# Patient Record
Sex: Female | Born: 1935 | Race: White | Hispanic: No | Smoking: Never smoker
Health system: Southern US, Community
[De-identification: ages and names within clinical notes are randomized; demographics above are authoritative.]

## PROBLEM LIST (undated history)

## (undated) DIAGNOSIS — E785 Hyperlipidemia, unspecified: Secondary | ICD-10-CM

## (undated) DIAGNOSIS — F32A Depression, unspecified: Secondary | ICD-10-CM

## (undated) DIAGNOSIS — I10 Essential (primary) hypertension: Secondary | ICD-10-CM

## (undated) DIAGNOSIS — I4891 Unspecified atrial fibrillation: Secondary | ICD-10-CM

## (undated) HISTORY — PX: CHOLECYSTECTOMY: SHX55

## (undated) HISTORY — PX: ABDOMINAL HYSTERECTOMY: SHX81

---

## 2005-03-17 ENCOUNTER — Ambulatory Visit: Payer: Self-pay | Admitting: Internal Medicine

## 2005-11-11 ENCOUNTER — Ambulatory Visit: Payer: Self-pay | Admitting: Ophthalmology

## 2005-11-19 ENCOUNTER — Ambulatory Visit: Payer: Self-pay | Admitting: Ophthalmology

## 2006-03-19 ENCOUNTER — Ambulatory Visit: Payer: Self-pay | Admitting: Internal Medicine

## 2007-03-23 ENCOUNTER — Ambulatory Visit: Payer: Self-pay | Admitting: Internal Medicine

## 2008-03-23 ENCOUNTER — Ambulatory Visit: Payer: Self-pay | Admitting: Internal Medicine

## 2008-04-20 ENCOUNTER — Ambulatory Visit: Payer: Self-pay | Admitting: Gastroenterology

## 2009-03-26 ENCOUNTER — Ambulatory Visit: Payer: Self-pay | Admitting: Internal Medicine

## 2010-02-20 ENCOUNTER — Ambulatory Visit: Payer: Self-pay | Admitting: Ophthalmology

## 2010-03-27 ENCOUNTER — Ambulatory Visit: Payer: Self-pay | Admitting: Internal Medicine

## 2011-04-01 ENCOUNTER — Ambulatory Visit: Payer: Self-pay | Admitting: Internal Medicine

## 2012-04-01 ENCOUNTER — Ambulatory Visit: Payer: Self-pay | Admitting: Internal Medicine

## 2012-09-20 ENCOUNTER — Emergency Department: Payer: Self-pay | Admitting: Emergency Medicine

## 2013-04-07 ENCOUNTER — Ambulatory Visit: Payer: Self-pay | Admitting: Internal Medicine

## 2014-03-09 DIAGNOSIS — E785 Hyperlipidemia, unspecified: Secondary | ICD-10-CM | POA: Insufficient documentation

## 2014-03-09 DIAGNOSIS — I10 Essential (primary) hypertension: Secondary | ICD-10-CM | POA: Insufficient documentation

## 2014-04-11 ENCOUNTER — Ambulatory Visit: Payer: Self-pay | Admitting: Internal Medicine

## 2015-03-13 ENCOUNTER — Other Ambulatory Visit: Payer: Self-pay | Admitting: Internal Medicine

## 2015-03-13 DIAGNOSIS — Z1231 Encounter for screening mammogram for malignant neoplasm of breast: Secondary | ICD-10-CM

## 2015-03-20 ENCOUNTER — Ambulatory Visit: Payer: Self-pay

## 2015-04-16 ENCOUNTER — Ambulatory Visit
Admission: RE | Admit: 2015-04-16 | Discharge: 2015-04-16 | Disposition: A | Discharge status: Home / Self Care | Payer: PPO | Source: Ambulatory Visit | Attending: Internal Medicine | Admitting: Internal Medicine

## 2015-04-16 DIAGNOSIS — Z1231 Encounter for screening mammogram for malignant neoplasm of breast: Secondary | ICD-10-CM | POA: Insufficient documentation

## 2015-08-08 ENCOUNTER — Ambulatory Visit
Admission: RE | Admit: 2015-08-08 | Discharge: 2015-08-08 | Disposition: A | Discharge status: Home / Self Care | Payer: PPO | Source: Ambulatory Visit | Attending: Internal Medicine | Admitting: Internal Medicine

## 2015-08-08 ENCOUNTER — Other Ambulatory Visit: Payer: Self-pay | Admitting: Internal Medicine

## 2015-08-08 DIAGNOSIS — R600 Localized edema: Secondary | ICD-10-CM | POA: Diagnosis not present

## 2015-08-08 DIAGNOSIS — R52 Pain, unspecified: Secondary | ICD-10-CM

## 2015-08-08 DIAGNOSIS — R609 Edema, unspecified: Secondary | ICD-10-CM

## 2015-08-08 DIAGNOSIS — I824Z1 Acute embolism and thrombosis of unspecified deep veins of right distal lower extremity: Secondary | ICD-10-CM | POA: Insufficient documentation

## 2015-09-06 DIAGNOSIS — I10 Essential (primary) hypertension: Secondary | ICD-10-CM | POA: Diagnosis not present

## 2015-09-13 DIAGNOSIS — I824Z1 Acute embolism and thrombosis of unspecified deep veins of right distal lower extremity: Secondary | ICD-10-CM | POA: Diagnosis not present

## 2015-09-13 DIAGNOSIS — I10 Essential (primary) hypertension: Secondary | ICD-10-CM | POA: Diagnosis not present

## 2015-10-09 DIAGNOSIS — I824Z1 Acute embolism and thrombosis of unspecified deep veins of right distal lower extremity: Secondary | ICD-10-CM | POA: Diagnosis not present

## 2015-10-16 DIAGNOSIS — I824Z1 Acute embolism and thrombosis of unspecified deep veins of right distal lower extremity: Secondary | ICD-10-CM | POA: Diagnosis not present

## 2015-10-16 DIAGNOSIS — I10 Essential (primary) hypertension: Secondary | ICD-10-CM | POA: Diagnosis not present

## 2016-04-10 DIAGNOSIS — I10 Essential (primary) hypertension: Secondary | ICD-10-CM | POA: Diagnosis not present

## 2016-04-10 DIAGNOSIS — N390 Urinary tract infection, site not specified: Secondary | ICD-10-CM | POA: Diagnosis not present

## 2016-04-17 DIAGNOSIS — I10 Essential (primary) hypertension: Secondary | ICD-10-CM | POA: Diagnosis not present

## 2016-04-17 DIAGNOSIS — Z0001 Encounter for general adult medical examination with abnormal findings: Secondary | ICD-10-CM | POA: Diagnosis not present

## 2016-04-17 DIAGNOSIS — E7800 Pure hypercholesterolemia, unspecified: Secondary | ICD-10-CM | POA: Diagnosis not present

## 2016-04-17 DIAGNOSIS — E2839 Other primary ovarian failure: Secondary | ICD-10-CM | POA: Diagnosis not present

## 2016-04-17 DIAGNOSIS — Z23 Encounter for immunization: Secondary | ICD-10-CM | POA: Diagnosis not present

## 2016-04-17 DIAGNOSIS — Z1231 Encounter for screening mammogram for malignant neoplasm of breast: Secondary | ICD-10-CM | POA: Diagnosis not present

## 2016-04-24 ENCOUNTER — Other Ambulatory Visit: Payer: Self-pay | Admitting: Internal Medicine

## 2016-04-24 DIAGNOSIS — Z1231 Encounter for screening mammogram for malignant neoplasm of breast: Secondary | ICD-10-CM

## 2016-05-06 DIAGNOSIS — E2839 Other primary ovarian failure: Secondary | ICD-10-CM | POA: Diagnosis not present

## 2016-05-09 ENCOUNTER — Ambulatory Visit
Admission: RE | Admit: 2016-05-09 | Discharge: 2016-05-09 | Disposition: A | Discharge status: Home / Self Care | Payer: PPO | Source: Ambulatory Visit | Attending: Internal Medicine | Admitting: Internal Medicine

## 2016-05-09 DIAGNOSIS — Z1231 Encounter for screening mammogram for malignant neoplasm of breast: Secondary | ICD-10-CM

## 2016-06-03 DIAGNOSIS — Z0000 Encounter for general adult medical examination without abnormal findings: Secondary | ICD-10-CM | POA: Diagnosis not present

## 2016-10-10 DIAGNOSIS — I10 Essential (primary) hypertension: Secondary | ICD-10-CM | POA: Diagnosis not present

## 2016-10-17 DIAGNOSIS — R001 Bradycardia, unspecified: Secondary | ICD-10-CM | POA: Diagnosis not present

## 2016-10-17 DIAGNOSIS — E7800 Pure hypercholesterolemia, unspecified: Secondary | ICD-10-CM | POA: Diagnosis not present

## 2016-10-17 DIAGNOSIS — I10 Essential (primary) hypertension: Secondary | ICD-10-CM | POA: Diagnosis not present

## 2016-10-30 DIAGNOSIS — E7800 Pure hypercholesterolemia, unspecified: Secondary | ICD-10-CM | POA: Diagnosis not present

## 2016-10-30 DIAGNOSIS — R002 Palpitations: Secondary | ICD-10-CM | POA: Diagnosis not present

## 2016-10-30 DIAGNOSIS — I10 Essential (primary) hypertension: Secondary | ICD-10-CM | POA: Diagnosis not present

## 2016-11-05 DIAGNOSIS — R002 Palpitations: Secondary | ICD-10-CM | POA: Diagnosis not present

## 2016-11-24 DIAGNOSIS — R002 Palpitations: Secondary | ICD-10-CM | POA: Diagnosis not present

## 2016-11-27 DIAGNOSIS — I493 Ventricular premature depolarization: Secondary | ICD-10-CM | POA: Diagnosis not present

## 2016-11-27 DIAGNOSIS — R002 Palpitations: Secondary | ICD-10-CM | POA: Diagnosis not present

## 2016-11-27 DIAGNOSIS — I10 Essential (primary) hypertension: Secondary | ICD-10-CM | POA: Diagnosis not present

## 2017-01-13 DIAGNOSIS — D2272 Melanocytic nevi of left lower limb, including hip: Secondary | ICD-10-CM | POA: Diagnosis not present

## 2017-01-13 DIAGNOSIS — D2261 Melanocytic nevi of right upper limb, including shoulder: Secondary | ICD-10-CM | POA: Diagnosis not present

## 2017-01-13 DIAGNOSIS — D2262 Melanocytic nevi of left upper limb, including shoulder: Secondary | ICD-10-CM | POA: Diagnosis not present

## 2017-01-13 DIAGNOSIS — D225 Melanocytic nevi of trunk: Secondary | ICD-10-CM | POA: Diagnosis not present

## 2017-01-13 DIAGNOSIS — C44729 Squamous cell carcinoma of skin of left lower limb, including hip: Secondary | ICD-10-CM | POA: Diagnosis not present

## 2017-01-13 DIAGNOSIS — D485 Neoplasm of uncertain behavior of skin: Secondary | ICD-10-CM | POA: Diagnosis not present

## 2017-01-20 DIAGNOSIS — C44729 Squamous cell carcinoma of skin of left lower limb, including hip: Secondary | ICD-10-CM | POA: Diagnosis not present

## 2017-01-29 DIAGNOSIS — I10 Essential (primary) hypertension: Secondary | ICD-10-CM | POA: Diagnosis not present

## 2017-01-29 DIAGNOSIS — I493 Ventricular premature depolarization: Secondary | ICD-10-CM | POA: Diagnosis not present

## 2017-01-29 DIAGNOSIS — R001 Bradycardia, unspecified: Secondary | ICD-10-CM | POA: Insufficient documentation

## 2017-03-13 DIAGNOSIS — I8393 Asymptomatic varicose veins of bilateral lower extremities: Secondary | ICD-10-CM | POA: Diagnosis not present

## 2017-03-31 DIAGNOSIS — F410 Panic disorder [episodic paroxysmal anxiety] without agoraphobia: Secondary | ICD-10-CM | POA: Diagnosis not present

## 2017-04-14 DIAGNOSIS — E7800 Pure hypercholesterolemia, unspecified: Secondary | ICD-10-CM | POA: Diagnosis not present

## 2017-04-14 DIAGNOSIS — I10 Essential (primary) hypertension: Secondary | ICD-10-CM | POA: Diagnosis not present

## 2017-04-14 DIAGNOSIS — R8290 Unspecified abnormal findings in urine: Secondary | ICD-10-CM | POA: Diagnosis not present

## 2017-04-21 DIAGNOSIS — E7800 Pure hypercholesterolemia, unspecified: Secondary | ICD-10-CM | POA: Diagnosis not present

## 2017-04-21 DIAGNOSIS — Z0001 Encounter for general adult medical examination with abnormal findings: Secondary | ICD-10-CM | POA: Diagnosis not present

## 2017-04-21 DIAGNOSIS — I10 Essential (primary) hypertension: Secondary | ICD-10-CM | POA: Diagnosis not present

## 2017-07-20 DIAGNOSIS — R002 Palpitations: Secondary | ICD-10-CM | POA: Diagnosis not present

## 2017-07-20 DIAGNOSIS — R001 Bradycardia, unspecified: Secondary | ICD-10-CM | POA: Diagnosis not present

## 2017-07-20 DIAGNOSIS — I10 Essential (primary) hypertension: Secondary | ICD-10-CM | POA: Diagnosis not present

## 2017-07-20 DIAGNOSIS — I493 Ventricular premature depolarization: Secondary | ICD-10-CM | POA: Diagnosis not present

## 2017-07-20 DIAGNOSIS — E7800 Pure hypercholesterolemia, unspecified: Secondary | ICD-10-CM | POA: Diagnosis not present

## 2017-10-06 DIAGNOSIS — H903 Sensorineural hearing loss, bilateral: Secondary | ICD-10-CM | POA: Diagnosis not present

## 2017-10-08 DIAGNOSIS — H903 Sensorineural hearing loss, bilateral: Secondary | ICD-10-CM | POA: Diagnosis not present

## 2017-10-12 DIAGNOSIS — I10 Essential (primary) hypertension: Secondary | ICD-10-CM | POA: Diagnosis not present

## 2017-10-19 DIAGNOSIS — I10 Essential (primary) hypertension: Secondary | ICD-10-CM | POA: Diagnosis not present

## 2017-10-19 DIAGNOSIS — E538 Deficiency of other specified B group vitamins: Secondary | ICD-10-CM | POA: Diagnosis not present

## 2017-10-19 DIAGNOSIS — Z0000 Encounter for general adult medical examination without abnormal findings: Secondary | ICD-10-CM | POA: Diagnosis not present

## 2017-10-19 DIAGNOSIS — E7800 Pure hypercholesterolemia, unspecified: Secondary | ICD-10-CM | POA: Diagnosis not present

## 2017-11-09 DIAGNOSIS — E538 Deficiency of other specified B group vitamins: Secondary | ICD-10-CM | POA: Diagnosis not present

## 2017-11-16 DIAGNOSIS — E538 Deficiency of other specified B group vitamins: Secondary | ICD-10-CM | POA: Diagnosis not present

## 2017-11-23 DIAGNOSIS — E538 Deficiency of other specified B group vitamins: Secondary | ICD-10-CM | POA: Diagnosis not present

## 2017-11-30 DIAGNOSIS — E538 Deficiency of other specified B group vitamins: Secondary | ICD-10-CM | POA: Diagnosis not present

## 2017-12-31 DIAGNOSIS — E538 Deficiency of other specified B group vitamins: Secondary | ICD-10-CM | POA: Diagnosis not present

## 2018-01-11 DIAGNOSIS — R001 Bradycardia, unspecified: Secondary | ICD-10-CM | POA: Diagnosis not present

## 2018-01-11 DIAGNOSIS — I10 Essential (primary) hypertension: Secondary | ICD-10-CM | POA: Diagnosis not present

## 2018-01-11 DIAGNOSIS — E7800 Pure hypercholesterolemia, unspecified: Secondary | ICD-10-CM | POA: Diagnosis not present

## 2018-01-11 DIAGNOSIS — I493 Ventricular premature depolarization: Secondary | ICD-10-CM | POA: Diagnosis not present

## 2018-01-31 ENCOUNTER — Other Ambulatory Visit: Payer: Self-pay

## 2018-01-31 ENCOUNTER — Emergency Department: Payer: PPO

## 2018-01-31 ENCOUNTER — Emergency Department
Admission: EM | Admit: 2018-01-31 | Discharge: 2018-01-31 | Disposition: A | Discharge status: Home / Self Care | Payer: PPO | Attending: Emergency Medicine | Admitting: Emergency Medicine

## 2018-01-31 DIAGNOSIS — I10 Essential (primary) hypertension: Secondary | ICD-10-CM | POA: Insufficient documentation

## 2018-01-31 DIAGNOSIS — Z79899 Other long term (current) drug therapy: Secondary | ICD-10-CM | POA: Insufficient documentation

## 2018-01-31 DIAGNOSIS — M25561 Pain in right knee: Secondary | ICD-10-CM

## 2018-01-31 DIAGNOSIS — M79604 Pain in right leg: Secondary | ICD-10-CM | POA: Insufficient documentation

## 2018-01-31 DIAGNOSIS — Z7982 Long term (current) use of aspirin: Secondary | ICD-10-CM | POA: Diagnosis not present

## 2018-01-31 DIAGNOSIS — M7121 Synovial cyst of popliteal space [Baker], right knee: Secondary | ICD-10-CM | POA: Insufficient documentation

## 2018-01-31 DIAGNOSIS — M25461 Effusion, right knee: Secondary | ICD-10-CM | POA: Diagnosis not present

## 2018-01-31 HISTORY — DX: Essential (primary) hypertension: I10

## 2018-01-31 NOTE — ED Notes (Signed)
Esign not working at this time. Pt verbalized discharge instructions and has no questions at this time. 

## 2018-01-31 NOTE — ED Triage Notes (Signed)
Pt arrives from [facility] for R knee and calf pain. Anterior knee is swollen. Posterior knee has a knot to it. Pt has hx of blood clots, states feels similar. No redness or extra warmth noted. Alert, oriented. Denies CP or SOB. Not currently taking blood thinners. Sees cardiologist for "extra beats", unsure if a fib.

## 2018-01-31 NOTE — ED Notes (Signed)
First Nurse Note: Pt brought over from [facility] in for possible blood clot in the right leg. Pt is in NAD.

## 2018-01-31 NOTE — ED Notes (Signed)
Patient transported to Ultrasound 

## 2018-01-31 NOTE — ED Provider Notes (Signed)
[facility] Emergency Department Provider Note  ____________________________________________  Time seen: Approximately 2:58 PM  I have reviewed the triage vital signs and the nursing notes.   HISTORY  Chief Complaint Leg Pain    HPI Amy Stanley is a 82 y.o. female who complains of gradual onset of right knee pain for the last few days.  Denies any trauma.  No chest pain shortness of breath fevers chills or sweats.  Reports swelling in the back of the knee as well as in the medial anterior aspect of the knee.  Pain is constant, mild, aching, nonradiating, no alleviating factors.  She reports that earlier this morning her lower leg around the calf was swollen to but that has since resolved.      Past Medical History:  Diagnosis Date  . Hypertension   Palpitations   There are no active problems to display for this patient.    Past Surgical History:  Procedure Laterality Date  . ABDOMINAL HYSTERECTOMY    . CHOLECYSTECTOMY       Prior to Admission medications   Not on File  Lexapro Melatonin Aspirin Lisinopril   Allergies Patient has no known allergies.   No family history on file.  Social History Social History   Tobacco Use  . Smoking status: Never Smoker  Substance Use Topics  . Alcohol use: Never    Frequency: Never  . Drug use: Not on file    Review of Systems  Constitutional:   No fever or chills.  Cardiovascular:   No chest pain or syncope. Respiratory:   No dyspnea or cough. Gastrointestinal:   Negative for abdominal pain, vomiting and diarrhea.  Musculoskeletal:   Right knee pain as above. All other systems reviewed and are negative except as documented above in ROS and HPI.  ____________________________________________   PHYSICAL EXAM:  VITAL SIGNS: ED Triage Vitals  Enc Vitals Group     BP 01/31/18 1308 136/90     Pulse Rate 01/31/18 1308 73     Resp 01/31/18 1308 16     Temp 01/31/18 1308 98.5 F (36.9  C)     Temp Source 01/31/18 1308 Oral     SpO2 01/31/18 1308 96 %     Weight 01/31/18 1309 150 lb (68 kg)     Height 01/31/18 1309 5\' 2"  (1.575 m)     Head Circumference --      Peak Flow --      Pain Score 01/31/18 1308 4     Pain Loc --      Pain Edu? --      Excl. in [place]? --     Vital signs reviewed, nursing assessments reviewed.   Constitutional:   Alert and oriented. Non-toxic appearance. Eyes:   Conjunctivae are normal. EOMI.  ENT      Head:   Normocephalic and atraumatic.            Neck:   No meningismus. Full ROM.  Cardiovascular:    Normal  DP pulses.   Respiratory:   Normal respiratory effort without tachypnea/retractions.   Musculoskeletal:   Normal range of motion in all extremities.  Mild right knee effusion.  Palpable swelling in the popliteal fossa of the right knee consistent with Baker's cyst.  No lower extremity tenderness.  No edema. Neurologic:   Normal speech and language.  Motor grossly intact. No acute focal neurologic deficits are appreciated.  Skin:    Skin is warm, dry and intact. No  rash noted.  No petechiae, purpura, or bullae.  No inflammatory changes.  ____________________________________________    LABS (pertinent positives/negatives) (all labs ordered are listed, but only abnormal results are displayed) Labs Reviewed - No data to display ____________________________________________   EKG    ____________________________________________    RADIOLOGY  US Venous Img Lower Unilateral Right  Result Date: 01/31/2018 CLINICAL DATA:  Right leg pain for 1 day EXAM: RIGHT LOWER EXTREMITY VENOUS DOPPLER ULTRASOUND TECHNIQUE: Gray-scale sonography with graded compression, as well as color Doppler and duplex ultrasound were performed to evaluate the lower extremity deep venous systems from the level of the common femoral vein and including the common femoral, femoral, profunda femoral, popliteal and calf veins including the posterior tibial,  peroneal and gastrocnemius veins when visible. The superficial great saphenous vein was also interrogated. Spectral Doppler was utilized to evaluate flow at rest and with distal augmentation maneuvers in the common femoral, femoral and popliteal veins. COMPARISON:  None. FINDINGS: Contralateral Common Femoral Vein: Respiratory phasicity is normal and symmetric with the symptomatic side. No evidence of thrombus. Normal compressibility. Common Femoral Vein: No evidence of thrombus. Normal compressibility, respiratory phasicity and response to augmentation. Saphenofemoral Junction: No evidence of thrombus. Normal compressibility and flow on color Doppler imaging. Profunda Femoral Vein: No evidence of thrombus. Normal compressibility and flow on color Doppler imaging. Femoral Vein: No evidence of thrombus. Normal compressibility, respiratory phasicity and response to augmentation. Popliteal Vein: No evidence of thrombus. Normal compressibility, respiratory phasicity and response to augmentation. Calf Veins: No evidence of thrombus. Normal compressibility and flow on color Doppler imaging. Superficial Great Saphenous Vein: No evidence of thrombus. Normal compressibility. Venous Reflux:  None. Other Findings: Popliteal cyst is noted measuring 5.5 x 1.8 x 3.6 cm. IMPRESSION: No evidence of deep venous thrombosis. Right popliteal cyst is noted. Electronically Signed   By: Inez Catalina M.D.   On: 01/31/2018 14:25   Dg Knee Complete 4 Views Right  Result Date: 01/31/2018 CLINICAL DATA:  Right knee and calf pain. EXAM: RIGHT KNEE - COMPLETE 4+ VIEW COMPARISON:  None. FINDINGS: No evidence of fracture, or dislocation. Mild 3 compartment osteoarthritic changes. There is a suprapatellar joint effusion. Mild soft tissue swelling about the right knee. IMPRESSION: No evidence of fracture or dislocation of the right knee. Suprapatellar joint effusion. Electronically Signed   By: Fidela Salisbury M.D.   On: 01/31/2018 14:31     ____________________________________________   PROCEDURES Procedures  ____________________________________________    CLINICAL IMPRESSION / ASSESSMENT AND PLAN / ED COURSE  Pertinent labs & imaging results that were available during my care of the patient were reviewed by me and considered in my medical decision making (see chart for details).    Patient presents with pain and swelling of the right knee.  Nontraumatic.  Ultrasound negative for DVT and in fact shows a Baker's cyst explaining her symptoms.  X-ray is negative for fracture dislocation or other acute pathology.  Doubt cellulitis abscess osteomyelitis septic arthritis necrotizing fasciitis.  Patient's nontoxic, suitable for discharge home.  Tylenol as needed, follow-up with primary care.      ____________________________________________   FINAL CLINICAL IMPRESSION(S) / ED DIAGNOSES    Final diagnoses:  Popliteal cyst, right  Acute pain of right knee     ED Discharge Orders    None      Portions of this note were generated with dragon dictation software. Dictation errors may occur despite best attempts at proofreading.    Carrie Mew, MD 01/31/18 1501

## 2018-01-31 NOTE — ED Notes (Signed)
Verbal orders from Dr. Cherylann Banas for imaging but denied needing blood work at this time.

## 2018-01-31 NOTE — Discharge Instructions (Addendum)
No results found for this or any previous visit. US Venous Img Lower Unilateral Right  Result Date: 01/31/2018 CLINICAL DATA:  Right leg pain for 1 day EXAM: RIGHT LOWER EXTREMITY VENOUS DOPPLER ULTRASOUND TECHNIQUE: Gray-scale sonography with graded compression, as well as color Doppler and duplex ultrasound were performed to evaluate the lower extremity deep venous systems from the level of the common femoral vein and including the common femoral, femoral, profunda femoral, popliteal and calf veins including the posterior tibial, peroneal and gastrocnemius veins when visible. The superficial great saphenous vein was also interrogated. Spectral Doppler was utilized to evaluate flow at rest and with distal augmentation maneuvers in the common femoral, femoral and popliteal veins. COMPARISON:  None. FINDINGS: Contralateral Common Femoral Vein: Respiratory phasicity is normal and symmetric with the symptomatic side. No evidence of thrombus. Normal compressibility. Common Femoral Vein: No evidence of thrombus. Normal compressibility, respiratory phasicity and response to augmentation. Saphenofemoral Junction: No evidence of thrombus. Normal compressibility and flow on color Doppler imaging. Profunda Femoral Vein: No evidence of thrombus. Normal compressibility and flow on color Doppler imaging. Femoral Vein: No evidence of thrombus. Normal compressibility, respiratory phasicity and response to augmentation. Popliteal Vein: No evidence of thrombus. Normal compressibility, respiratory phasicity and response to augmentation. Calf Veins: No evidence of thrombus. Normal compressibility and flow on color Doppler imaging. Superficial Great Saphenous Vein: No evidence of thrombus. Normal compressibility. Venous Reflux:  None. Other Findings: Popliteal cyst is noted measuring 5.5 x 1.8 x 3.6 cm. IMPRESSION: No evidence of deep venous thrombosis. Right popliteal cyst is noted. Electronically Signed   By: Inez Catalina M.D.    On: 01/31/2018 14:25   Dg Knee Complete 4 Views Right  Result Date: 01/31/2018 CLINICAL DATA:  Right knee and calf pain. EXAM: RIGHT KNEE - COMPLETE 4+ VIEW COMPARISON:  None. FINDINGS: No evidence of fracture, or dislocation. Mild 3 compartment osteoarthritic changes. There is a suprapatellar joint effusion. Mild soft tissue swelling about the right knee. IMPRESSION: No evidence of fracture or dislocation of the right knee. Suprapatellar joint effusion. Electronically Signed   By: Fidela Salisbury M.D.   On: 01/31/2018 14:31

## 2018-02-01 DIAGNOSIS — E538 Deficiency of other specified B group vitamins: Secondary | ICD-10-CM | POA: Diagnosis not present

## 2018-03-04 DIAGNOSIS — E538 Deficiency of other specified B group vitamins: Secondary | ICD-10-CM | POA: Diagnosis not present

## 2018-04-06 DIAGNOSIS — E538 Deficiency of other specified B group vitamins: Secondary | ICD-10-CM | POA: Diagnosis not present

## 2018-04-15 DIAGNOSIS — I10 Essential (primary) hypertension: Secondary | ICD-10-CM | POA: Diagnosis not present

## 2018-04-15 DIAGNOSIS — E538 Deficiency of other specified B group vitamins: Secondary | ICD-10-CM | POA: Diagnosis not present

## 2018-04-15 DIAGNOSIS — E7800 Pure hypercholesterolemia, unspecified: Secondary | ICD-10-CM | POA: Diagnosis not present

## 2018-04-22 DIAGNOSIS — R42 Dizziness and giddiness: Secondary | ICD-10-CM | POA: Diagnosis not present

## 2018-04-22 DIAGNOSIS — I10 Essential (primary) hypertension: Secondary | ICD-10-CM | POA: Diagnosis not present

## 2018-04-22 DIAGNOSIS — E7800 Pure hypercholesterolemia, unspecified: Secondary | ICD-10-CM | POA: Diagnosis not present

## 2018-04-22 DIAGNOSIS — Z1239 Encounter for other screening for malignant neoplasm of breast: Secondary | ICD-10-CM | POA: Diagnosis not present

## 2018-04-22 DIAGNOSIS — Z0001 Encounter for general adult medical examination with abnormal findings: Secondary | ICD-10-CM | POA: Diagnosis not present

## 2018-04-22 DIAGNOSIS — M7121 Synovial cyst of popliteal space [Baker], right knee: Secondary | ICD-10-CM | POA: Diagnosis not present

## 2018-05-07 DIAGNOSIS — E538 Deficiency of other specified B group vitamins: Secondary | ICD-10-CM | POA: Diagnosis not present

## 2018-06-07 DIAGNOSIS — E538 Deficiency of other specified B group vitamins: Secondary | ICD-10-CM | POA: Diagnosis not present

## 2018-07-08 DIAGNOSIS — E538 Deficiency of other specified B group vitamins: Secondary | ICD-10-CM | POA: Diagnosis not present

## 2018-08-09 DIAGNOSIS — E538 Deficiency of other specified B group vitamins: Secondary | ICD-10-CM | POA: Diagnosis not present

## 2018-08-27 DIAGNOSIS — F329 Major depressive disorder, single episode, unspecified: Secondary | ICD-10-CM | POA: Diagnosis not present

## 2018-08-27 DIAGNOSIS — I10 Essential (primary) hypertension: Secondary | ICD-10-CM | POA: Diagnosis not present

## 2018-08-27 DIAGNOSIS — F32A Depression, unspecified: Secondary | ICD-10-CM | POA: Insufficient documentation

## 2018-08-27 DIAGNOSIS — R413 Other amnesia: Secondary | ICD-10-CM | POA: Diagnosis not present

## 2018-08-27 DIAGNOSIS — G4700 Insomnia, unspecified: Secondary | ICD-10-CM | POA: Insufficient documentation

## 2018-09-08 DIAGNOSIS — R413 Other amnesia: Secondary | ICD-10-CM | POA: Diagnosis not present

## 2018-09-09 DIAGNOSIS — R413 Other amnesia: Secondary | ICD-10-CM | POA: Insufficient documentation

## 2018-12-08 DIAGNOSIS — R413 Other amnesia: Secondary | ICD-10-CM | POA: Diagnosis not present

## 2019-01-27 DIAGNOSIS — E7800 Pure hypercholesterolemia, unspecified: Secondary | ICD-10-CM | POA: Diagnosis not present

## 2019-01-27 DIAGNOSIS — I10 Essential (primary) hypertension: Secondary | ICD-10-CM | POA: Diagnosis not present

## 2019-01-27 DIAGNOSIS — I493 Ventricular premature depolarization: Secondary | ICD-10-CM | POA: Diagnosis not present

## 2019-02-03 DIAGNOSIS — E538 Deficiency of other specified B group vitamins: Secondary | ICD-10-CM | POA: Diagnosis not present

## 2019-02-03 DIAGNOSIS — Z0000 Encounter for general adult medical examination without abnormal findings: Secondary | ICD-10-CM | POA: Diagnosis not present

## 2019-02-03 DIAGNOSIS — F329 Major depressive disorder, single episode, unspecified: Secondary | ICD-10-CM | POA: Diagnosis not present

## 2019-02-03 DIAGNOSIS — E7800 Pure hypercholesterolemia, unspecified: Secondary | ICD-10-CM | POA: Diagnosis not present

## 2019-02-03 DIAGNOSIS — I10 Essential (primary) hypertension: Secondary | ICD-10-CM | POA: Diagnosis not present

## 2019-02-03 DIAGNOSIS — G4700 Insomnia, unspecified: Secondary | ICD-10-CM | POA: Diagnosis not present

## 2019-02-03 DIAGNOSIS — R413 Other amnesia: Secondary | ICD-10-CM | POA: Diagnosis not present

## 2019-04-18 DIAGNOSIS — R413 Other amnesia: Secondary | ICD-10-CM | POA: Diagnosis not present

## 2019-06-06 DIAGNOSIS — E7800 Pure hypercholesterolemia, unspecified: Secondary | ICD-10-CM | POA: Diagnosis not present

## 2019-06-06 DIAGNOSIS — R197 Diarrhea, unspecified: Secondary | ICD-10-CM | POA: Diagnosis not present

## 2019-06-06 DIAGNOSIS — E538 Deficiency of other specified B group vitamins: Secondary | ICD-10-CM | POA: Diagnosis not present

## 2019-06-06 DIAGNOSIS — R413 Other amnesia: Secondary | ICD-10-CM | POA: Diagnosis not present

## 2019-06-06 DIAGNOSIS — I10 Essential (primary) hypertension: Secondary | ICD-10-CM | POA: Diagnosis not present

## 2019-06-06 DIAGNOSIS — F329 Major depressive disorder, single episode, unspecified: Secondary | ICD-10-CM | POA: Diagnosis not present

## 2019-07-22 DIAGNOSIS — Z08 Encounter for follow-up examination after completed treatment for malignant neoplasm: Secondary | ICD-10-CM | POA: Diagnosis not present

## 2019-07-22 DIAGNOSIS — Z85828 Personal history of other malignant neoplasm of skin: Secondary | ICD-10-CM | POA: Diagnosis not present

## 2019-07-22 DIAGNOSIS — D0421 Carcinoma in situ of skin of right ear and external auricular canal: Secondary | ICD-10-CM | POA: Diagnosis not present

## 2019-07-22 DIAGNOSIS — D485 Neoplasm of uncertain behavior of skin: Secondary | ICD-10-CM | POA: Diagnosis not present

## 2019-08-30 DIAGNOSIS — R413 Other amnesia: Secondary | ICD-10-CM | POA: Diagnosis not present

## 2019-09-27 DIAGNOSIS — D0421 Carcinoma in situ of skin of right ear and external auricular canal: Secondary | ICD-10-CM | POA: Diagnosis not present

## 2019-10-07 DIAGNOSIS — I10 Essential (primary) hypertension: Secondary | ICD-10-CM | POA: Diagnosis not present

## 2019-10-07 DIAGNOSIS — E7800 Pure hypercholesterolemia, unspecified: Secondary | ICD-10-CM | POA: Diagnosis not present

## 2019-10-07 DIAGNOSIS — F329 Major depressive disorder, single episode, unspecified: Secondary | ICD-10-CM | POA: Diagnosis not present

## 2019-10-07 DIAGNOSIS — Z0000 Encounter for general adult medical examination without abnormal findings: Secondary | ICD-10-CM | POA: Diagnosis not present

## 2019-10-07 DIAGNOSIS — R413 Other amnesia: Secondary | ICD-10-CM | POA: Diagnosis not present

## 2019-10-07 DIAGNOSIS — Z0001 Encounter for general adult medical examination with abnormal findings: Secondary | ICD-10-CM | POA: Diagnosis not present

## 2019-10-07 DIAGNOSIS — G4700 Insomnia, unspecified: Secondary | ICD-10-CM | POA: Diagnosis not present

## 2020-01-11 DIAGNOSIS — I493 Ventricular premature depolarization: Secondary | ICD-10-CM | POA: Diagnosis not present

## 2020-01-11 DIAGNOSIS — R9431 Abnormal electrocardiogram [ECG] [EKG]: Secondary | ICD-10-CM | POA: Diagnosis not present

## 2020-01-11 DIAGNOSIS — I10 Essential (primary) hypertension: Secondary | ICD-10-CM | POA: Diagnosis not present

## 2020-01-11 DIAGNOSIS — E7800 Pure hypercholesterolemia, unspecified: Secondary | ICD-10-CM | POA: Diagnosis not present

## 2020-03-12 DIAGNOSIS — R413 Other amnesia: Secondary | ICD-10-CM | POA: Diagnosis not present

## 2020-04-10 DIAGNOSIS — E538 Deficiency of other specified B group vitamins: Secondary | ICD-10-CM | POA: Diagnosis not present

## 2020-04-10 DIAGNOSIS — R197 Diarrhea, unspecified: Secondary | ICD-10-CM | POA: Insufficient documentation

## 2020-04-10 DIAGNOSIS — Z23 Encounter for immunization: Secondary | ICD-10-CM | POA: Diagnosis not present

## 2020-04-10 DIAGNOSIS — G4700 Insomnia, unspecified: Secondary | ICD-10-CM | POA: Diagnosis not present

## 2020-04-10 DIAGNOSIS — R413 Other amnesia: Secondary | ICD-10-CM | POA: Diagnosis not present

## 2020-04-10 DIAGNOSIS — I10 Essential (primary) hypertension: Secondary | ICD-10-CM | POA: Diagnosis not present

## 2020-04-10 DIAGNOSIS — E7800 Pure hypercholesterolemia, unspecified: Secondary | ICD-10-CM | POA: Diagnosis not present

## 2020-04-10 DIAGNOSIS — F329 Major depressive disorder, single episode, unspecified: Secondary | ICD-10-CM | POA: Diagnosis not present

## 2020-04-10 DIAGNOSIS — R002 Palpitations: Secondary | ICD-10-CM | POA: Diagnosis not present

## 2020-05-16 DIAGNOSIS — R058 Other specified cough: Secondary | ICD-10-CM | POA: Diagnosis not present

## 2020-05-16 DIAGNOSIS — J40 Bronchitis, not specified as acute or chronic: Secondary | ICD-10-CM | POA: Diagnosis not present

## 2020-05-16 DIAGNOSIS — R531 Weakness: Secondary | ICD-10-CM | POA: Diagnosis not present

## 2020-05-28 DIAGNOSIS — R197 Diarrhea, unspecified: Secondary | ICD-10-CM | POA: Diagnosis not present

## 2020-05-28 DIAGNOSIS — J189 Pneumonia, unspecified organism: Secondary | ICD-10-CM | POA: Diagnosis not present

## 2020-05-30 DIAGNOSIS — R197 Diarrhea, unspecified: Secondary | ICD-10-CM | POA: Diagnosis not present

## 2020-06-06 DIAGNOSIS — R197 Diarrhea, unspecified: Secondary | ICD-10-CM | POA: Diagnosis not present

## 2020-06-27 ENCOUNTER — Other Ambulatory Visit
Admission: RE | Admit: 2020-06-27 | Discharge: 2020-06-27 | Disposition: A | Discharge status: Home / Self Care | Payer: PPO | Source: Ambulatory Visit | Attending: Gastroenterology | Admitting: Gastroenterology

## 2020-06-27 DIAGNOSIS — R197 Diarrhea, unspecified: Secondary | ICD-10-CM | POA: Insufficient documentation

## 2020-07-02 LAB — PANCREATIC ELASTASE, FECAL: Pancreatic Elastase-1, Stool: 410 ug Elast./g (ref >200)

## 2020-07-24 DIAGNOSIS — R410 Disorientation, unspecified: Secondary | ICD-10-CM | POA: Diagnosis not present

## 2020-07-24 DIAGNOSIS — R413 Other amnesia: Secondary | ICD-10-CM | POA: Diagnosis not present

## 2020-09-14 DIAGNOSIS — R399 Unspecified symptoms and signs involving the genitourinary system: Secondary | ICD-10-CM | POA: Diagnosis not present

## 2020-09-18 DIAGNOSIS — D2272 Melanocytic nevi of left lower limb, including hip: Secondary | ICD-10-CM | POA: Diagnosis not present

## 2020-09-18 DIAGNOSIS — L608 Other nail disorders: Secondary | ICD-10-CM | POA: Diagnosis not present

## 2020-09-18 DIAGNOSIS — D225 Melanocytic nevi of trunk: Secondary | ICD-10-CM | POA: Diagnosis not present

## 2020-09-18 DIAGNOSIS — D2261 Melanocytic nevi of right upper limb, including shoulder: Secondary | ICD-10-CM | POA: Diagnosis not present

## 2020-09-18 DIAGNOSIS — D2271 Melanocytic nevi of right lower limb, including hip: Secondary | ICD-10-CM | POA: Diagnosis not present

## 2020-09-18 DIAGNOSIS — D2262 Melanocytic nevi of left upper limb, including shoulder: Secondary | ICD-10-CM | POA: Diagnosis not present

## 2020-09-24 DIAGNOSIS — R197 Diarrhea, unspecified: Secondary | ICD-10-CM | POA: Diagnosis not present

## 2020-09-24 DIAGNOSIS — R630 Anorexia: Secondary | ICD-10-CM | POA: Diagnosis not present

## 2020-09-24 DIAGNOSIS — H9190 Unspecified hearing loss, unspecified ear: Secondary | ICD-10-CM | POA: Diagnosis not present

## 2020-09-24 DIAGNOSIS — R413 Other amnesia: Secondary | ICD-10-CM | POA: Diagnosis not present

## 2020-09-24 DIAGNOSIS — F05 Delirium due to known physiological condition: Secondary | ICD-10-CM | POA: Diagnosis not present

## 2020-09-25 DIAGNOSIS — R630 Anorexia: Secondary | ICD-10-CM | POA: Insufficient documentation

## 2020-09-25 DIAGNOSIS — H9190 Unspecified hearing loss, unspecified ear: Secondary | ICD-10-CM | POA: Insufficient documentation

## 2020-09-25 DIAGNOSIS — F05 Delirium due to known physiological condition: Secondary | ICD-10-CM | POA: Insufficient documentation

## 2020-10-03 DIAGNOSIS — E7800 Pure hypercholesterolemia, unspecified: Secondary | ICD-10-CM | POA: Diagnosis not present

## 2020-10-03 DIAGNOSIS — E538 Deficiency of other specified B group vitamins: Secondary | ICD-10-CM | POA: Diagnosis not present

## 2020-10-03 DIAGNOSIS — I10 Essential (primary) hypertension: Secondary | ICD-10-CM | POA: Diagnosis not present

## 2020-10-06 ENCOUNTER — Inpatient Hospital Stay
Admission: EM | Admit: 2020-10-06 | Discharge: 2020-10-08 | DRG: 309 | Disposition: A | Discharge status: Home / Self Care | Payer: PPO | Attending: Obstetrics and Gynecology | Admitting: Obstetrics and Gynecology

## 2020-10-06 ENCOUNTER — Other Ambulatory Visit: Payer: Self-pay

## 2020-10-06 DIAGNOSIS — Z20822 Contact with and (suspected) exposure to covid-19: Secondary | ICD-10-CM | POA: Diagnosis present

## 2020-10-06 DIAGNOSIS — G309 Alzheimer's disease, unspecified: Secondary | ICD-10-CM | POA: Diagnosis not present

## 2020-10-06 DIAGNOSIS — I10 Essential (primary) hypertension: Secondary | ICD-10-CM | POA: Diagnosis present

## 2020-10-06 DIAGNOSIS — I4891 Unspecified atrial fibrillation: Secondary | ICD-10-CM | POA: Diagnosis not present

## 2020-10-06 DIAGNOSIS — R000 Tachycardia, unspecified: Secondary | ICD-10-CM | POA: Diagnosis not present

## 2020-10-06 DIAGNOSIS — F0390 Unspecified dementia without behavioral disturbance: Secondary | ICD-10-CM | POA: Diagnosis not present

## 2020-10-06 DIAGNOSIS — I493 Ventricular premature depolarization: Secondary | ICD-10-CM | POA: Diagnosis not present

## 2020-10-06 DIAGNOSIS — N390 Urinary tract infection, site not specified: Secondary | ICD-10-CM | POA: Diagnosis present

## 2020-10-06 DIAGNOSIS — R350 Frequency of micturition: Secondary | ICD-10-CM | POA: Diagnosis present

## 2020-10-06 DIAGNOSIS — F0280 Dementia in other diseases classified elsewhere without behavioral disturbance: Secondary | ICD-10-CM | POA: Diagnosis not present

## 2020-10-06 DIAGNOSIS — F32A Depression, unspecified: Secondary | ICD-10-CM | POA: Diagnosis present

## 2020-10-06 DIAGNOSIS — K529 Noninfective gastroenteritis and colitis, unspecified: Secondary | ICD-10-CM | POA: Diagnosis present

## 2020-10-06 DIAGNOSIS — R451 Restlessness and agitation: Secondary | ICD-10-CM | POA: Diagnosis present

## 2020-10-06 DIAGNOSIS — G4700 Insomnia, unspecified: Secondary | ICD-10-CM | POA: Diagnosis present

## 2020-10-06 DIAGNOSIS — Z90710 Acquired absence of both cervix and uterus: Secondary | ICD-10-CM | POA: Diagnosis not present

## 2020-10-06 DIAGNOSIS — N3000 Acute cystitis without hematuria: Secondary | ICD-10-CM | POA: Diagnosis not present

## 2020-10-06 DIAGNOSIS — Z66 Do not resuscitate: Secondary | ICD-10-CM | POA: Diagnosis not present

## 2020-10-06 DIAGNOSIS — R4020 Unspecified coma: Secondary | ICD-10-CM | POA: Diagnosis not present

## 2020-10-06 DIAGNOSIS — R55 Syncope and collapse: Secondary | ICD-10-CM | POA: Diagnosis present

## 2020-10-06 DIAGNOSIS — I480 Paroxysmal atrial fibrillation: Secondary | ICD-10-CM | POA: Diagnosis not present

## 2020-10-06 DIAGNOSIS — F329 Major depressive disorder, single episode, unspecified: Secondary | ICD-10-CM | POA: Diagnosis present

## 2020-10-06 DIAGNOSIS — Z9049 Acquired absence of other specified parts of digestive tract: Secondary | ICD-10-CM

## 2020-10-06 DIAGNOSIS — R42 Dizziness and giddiness: Secondary | ICD-10-CM | POA: Diagnosis not present

## 2020-10-06 DIAGNOSIS — E860 Dehydration: Secondary | ICD-10-CM | POA: Diagnosis not present

## 2020-10-06 LAB — COMPREHENSIVE METABOLIC PANEL
ALT: 14 U/L (ref 0–44)
AST: 29 U/L (ref 15–41)
Albumin: 2.9 g/dL — ABNORMAL LOW (ref 3.5–5.0)
Alkaline Phosphatase: 35 U/L — ABNORMAL LOW (ref 38–126)
Anion gap: 8 (ref 5–15)
BUN: 14 mg/dL (ref 8–23)
CO2: 17 mmol/L — ABNORMAL LOW (ref 22–32)
Calcium: 7.9 mg/dL — ABNORMAL LOW (ref 8.9–10.3)
Chloride: 116 mmol/L — ABNORMAL HIGH (ref 98–111)
Creatinine, Ser: 0.86 mg/dL (ref 0.44–1.00)
GFR, Estimated: >60 mL/min (ref >60)
Glucose, Bld: 112 mg/dL — ABNORMAL HIGH (ref 70–99)
Potassium: 3.6 mmol/L (ref 3.5–5.1)
Sodium: 141 mmol/L (ref 135–145)
Total Bilirubin: 0.9 mg/dL (ref 0.3–1.2)
Total Protein: 5.2 g/dL — ABNORMAL LOW (ref 6.5–8.1)

## 2020-10-06 LAB — TROPONIN I (HIGH SENSITIVITY): Troponin I (High Sensitivity): 29 ng/L — ABNORMAL HIGH (ref <18)

## 2020-10-06 LAB — URINALYSIS, COMPLETE (UACMP) WITH MICROSCOPIC
Bilirubin Urine: NEGATIVE
Glucose, UA: NEGATIVE mg/dL
Ketones, ur: NEGATIVE mg/dL
Nitrite: NEGATIVE
Protein, ur: 100 mg/dL — AB
RBC / HPF: >50 RBC/hpf — ABNORMAL HIGH (ref 0–5)
Specific Gravity, Urine: 1.008 (ref 1.005–1.030)
Squamous Epithelial / LPF: NONE SEEN (ref 0–5)
WBC, UA: >50 WBC/hpf — ABNORMAL HIGH (ref 0–5)
pH: 8 (ref 5.0–8.0)

## 2020-10-06 LAB — TSH: TSH: 1.737 u[IU]/mL (ref 0.350–4.500)

## 2020-10-06 LAB — CBC
HCT: 42.4 % (ref 36.0–46.0)
Hemoglobin: 13.7 g/dL (ref 12.0–15.0)
MCH: 31 pg (ref 26.0–34.0)
MCHC: 32.3 g/dL (ref 30.0–36.0)
MCV: 95.9 fL (ref 80.0–100.0)
Platelets: 215 10*3/uL (ref 150–400)
RBC: 4.42 MIL/uL (ref 3.87–5.11)
RDW: 14.6 % (ref 11.5–15.5)
WBC: 6.6 10*3/uL (ref 4.0–10.5)
nRBC: 0 % (ref 0.0–0.2)

## 2020-10-06 MED ORDER — ACETAMINOPHEN 325 MG PO TABS: 650.0000 mg | ORAL_TABLET | Freq: Four times a day (QID) | ORAL | Status: DC | PRN

## 2020-10-06 MED ORDER — SODIUM CHLORIDE 0.9 % IV SOLN
1.0000 g | INTRAVENOUS | Status: DC
  Filled 2020-10-06: qty 10

## 2020-10-06 MED ORDER — DILTIAZEM HCL-DEXTROSE 125-5 MG/125ML-% IV SOLN (PREMIX): 5.0000 mg/h | INTRAVENOUS | Status: DC

## 2020-10-06 MED ORDER — TRAZODONE HCL 50 MG PO TABS
50.0000 mg | ORAL_TABLET | Freq: Every day | ORAL | Status: DC
Start: 2020-10-06 — End: 2020-10-08
  Administered 2020-10-06 – 2020-10-07 (×2): 50 mg via ORAL
  Filled 2020-10-06 (×2): qty 1

## 2020-10-06 MED ORDER — ACETAMINOPHEN 650 MG RE SUPP: 650.0000 mg | Freq: Four times a day (QID) | RECTAL | Status: DC | PRN

## 2020-10-06 MED ORDER — SODIUM CHLORIDE 0.9% FLUSH
3.0000 mL | Freq: Two times a day (BID) | INTRAVENOUS | Status: DC
  Administered 2020-10-06 – 2020-10-07 (×3): 3 mL via INTRAVENOUS

## 2020-10-06 MED ORDER — SODIUM CHLORIDE 0.9 % IV SOLN: INTRAVENOUS | Status: DC

## 2020-10-06 MED ORDER — ESCITALOPRAM OXALATE 10 MG PO TABS
10.0000 mg | ORAL_TABLET | Freq: Every day | ORAL | Status: DC
Start: 2020-10-07 — End: 2020-10-08
  Administered 2020-10-07 – 2020-10-08 (×2): 10 mg via ORAL
  Filled 2020-10-06 (×2): qty 1

## 2020-10-06 MED ORDER — SODIUM CHLORIDE 0.9 % IV SOLN
1000.0000 mL | Freq: Once | INTRAVENOUS | Status: AC
  Administered 2020-10-06: 1000 mL via INTRAVENOUS

## 2020-10-06 MED ORDER — ONDANSETRON HCL 4 MG PO TABS: 4.0000 mg | ORAL_TABLET | Freq: Four times a day (QID) | ORAL | Status: DC | PRN

## 2020-10-06 MED ORDER — SODIUM CHLORIDE 0.9 % IV SOLN: 250.0000 mL | INTRAVENOUS | Status: DC | PRN

## 2020-10-06 MED ORDER — MEMANTINE HCL 5 MG PO TABS
5.0000 mg | ORAL_TABLET | Freq: Two times a day (BID) | ORAL | Status: DC
  Administered 2020-10-06 – 2020-10-08 (×4): 5 mg via ORAL
  Filled 2020-10-06 (×5): qty 1

## 2020-10-06 MED ORDER — SODIUM CHLORIDE 0.9 % IV SOLN
1.0000 g | Freq: Once | INTRAVENOUS | Status: AC
  Administered 2020-10-06: 1 g via INTRAVENOUS
  Filled 2020-10-06: qty 10

## 2020-10-06 MED ORDER — DILTIAZEM HCL ER COATED BEADS 180 MG PO CP24
180.0000 mg | ORAL_CAPSULE | Freq: Every day | ORAL | Status: DC
  Administered 2020-10-06 – 2020-10-08 (×3): 180 mg via ORAL
  Filled 2020-10-06 (×4): qty 1

## 2020-10-06 MED ORDER — ENOXAPARIN SODIUM 40 MG/0.4ML ~~LOC~~ SOLN
40.0000 mg | SUBCUTANEOUS | Status: DC
  Administered 2020-10-06 – 2020-10-07 (×2): 40 mg via SUBCUTANEOUS
  Filled 2020-10-06 (×2): qty 0.4

## 2020-10-06 MED ORDER — LISINOPRIL 10 MG PO TABS
5.0000 mg | ORAL_TABLET | Freq: Every day | ORAL | Status: DC
Start: 2020-10-07 — End: 2020-10-08
  Administered 2020-10-07 – 2020-10-08 (×2): 5 mg via ORAL
  Filled 2020-10-06 (×2): qty 1

## 2020-10-06 MED ORDER — SODIUM CHLORIDE 0.9% FLUSH: 3.0000 mL | INTRAVENOUS | Status: DC | PRN

## 2020-10-06 MED ORDER — ONDANSETRON HCL 4 MG/2ML IJ SOLN: 4.0000 mg | Freq: Four times a day (QID) | INTRAMUSCULAR | Status: DC | PRN

## 2020-10-06 NOTE — ED Notes (Signed)
Cardiology at bedside.

## 2020-10-06 NOTE — ED Notes (Signed)
Pt had small amount of brown/yellow liquid diarrhea in brief. Cleaned with wipes, new brief placed on pt.

## 2020-10-06 NOTE — ED Notes (Signed)
Pt sat up to eat lunch tray. Son remains at bedside.

## 2020-10-06 NOTE — ED Triage Notes (Addendum)
Pt arrives [facility] from home for syncopal episode this AM. Son told EMS that pt was walking back from bathroom and appeared shaky. Son assisted pt to sitting position and pt passed out for a few seconds. VSS and CBG WNL. Pt with hx dementia. EMS states intermittent diarrhea x 2 years. Pt told EMS she had diarrhea on the way to ER. This RN noticed very small amount of liquid stool to brief. Cleaned and changed brief. Denies pain at this time. Denies dizziness or weakness. EMS gave pt 544ml NS enroute.

## 2020-10-06 NOTE — Consult Note (Signed)
[facility] Cardiology  CARDIOLOGY CONSULT NOTE  Patient ID: ASMI FUGERE MRN: [id number] DOB/AGE: March 31, 1936 85 y.o.  Admit date: 10/06/2020 Referring Physician [place] Primary Physician [facility] Cardiologist Nehemiah Massed Reason for Consultation syncope, paroxysmal atrial fibrillation  HPI: 85 year old female referred for syncope and paroxysmal atrial fibrillation.  The patient has advanced dementia and currently lives with her son who provides her clinical history.  This morning, while she was walking back from the bathroom, she appeared shaky, and required assistance to sit in a chair.  Approximately one hour later, she was not responding and the son assisted her to lay on the ground, then the patient had a large loose bowel movement.  EMS was called, the patient received 500 cc of normal saline, and brought to [facility] ED.  Initial ECG revealed atrial fibrillation at a rate of 118 bpm, and the patient was treated with diltiazem IV bolus, and converted to sinus rhythm.  Patient denies chest pain or shortness of breath.  The patient remains in sinus rhythm.  Admission labs notable for initial high-sensitivity troponin of 29.  Blood pressure is 170/95.  The patient currently takes lisinopril for essential hypertension.  Review of systems complete and found to be negative unless listed above     Past Medical History:  Diagnosis Date  . Hypertension     Past Surgical History:  Procedure Laterality Date  . ABDOMINAL HYSTERECTOMY    . CHOLECYSTECTOMY      (Not in a hospital admission)  Social History   Socioeconomic History  . Marital status: Widowed    Spouse name: Not on file  . Number of children: Not on file  . Years of education: Not on file  . Highest education level: Not on file  Occupational History  . Not on file  Tobacco Use  . Smoking status: Never Smoker  . Smokeless tobacco: Not on file  Substance and Sexual Activity  . Alcohol use: Never  . Drug use: Not on file  .  Sexual activity: Not on file  Other Topics Concern  . Not on file  Social History Narrative  . Not on file   Social Determinants of Health   Financial Resource Strain: Not on file  Food Insecurity: Not on file  Transportation Needs: Not on file  Physical Activity: Not on file  Stress: Not on file  Social Connections: Not on file  Intimate Partner Violence: Not on file    History reviewed. No pertinent family history.    Review of systems complete and found to be negative unless listed above      PHYSICAL EXAM  General: Well developed, well nourished, in no acute distress HEENT:  Normocephalic and atramatic Neck:  No JVD.  Lungs: Clear bilaterally to auscultation and percussion. Heart: HRRR . Normal S1 and S2 without gallops or murmurs.  Abdomen: Bowel sounds are positive, abdomen soft and non-tender  Msk:  Back normal, normal gait. Normal strength and tone for age. Extremities: No clubbing, cyanosis or edema.   Neuro: Alert and oriented X 3. Psych:  Good affect, responds appropriately  Labs:   Lab Results  Component Value Date   WBC 6.6 10/06/2020   HGB 13.7 10/06/2020   HCT 42.4 10/06/2020   MCV 95.9 10/06/2020   PLT 215 10/06/2020    Recent Labs  Lab 10/06/20 1020  NA 141  K 3.6  CL 116*  CO2 17*  BUN 14  CREATININE 0.86  CALCIUM 7.9*  PROT 5.2*  BILITOT 0.9  ALKPHOS 35*  ALT 14  AST 29  GLUCOSE 112*   No results found for: CKTOTAL, CKMB, CKMBINDEX, TROPONINI No results found for: CHOL No results found for: HDL No results found for: LDLCALC No results found for: TRIG No results found for: CHOLHDL No results found for: LDLDIRECT    Radiology: No results found.  EKG: Sinus rhythm at 85 bpm with frequent premature atrial contraction  ASSESSMENT AND PLAN:   1.  Near syncope, without complete loss of consciousness, in the setting of atrial fibrillation with rapid ventricular rate 2.  Paroxysmal atrial fibrillation, converted to sinus rhythm  with diltiazem IV bolus 3.  Essential hypertension, blood pressure mildly elevated  Recommendations  1.  Agree with overall current therapy 2.  Defer chronic anticoagulation at this time 3.  Start Cardizem CD 180 mg daily 4.  Review 2D echocardiogram 5.  Further recommendations pending echocardiogram results  Signed: Isaias Cowman MD,PhD, [facility] 10/06/2020, 2:00 PM

## 2020-10-06 NOTE — ED Notes (Signed)
Called lab d/t labs not being "in process" at this time. Stated they would begin running CMP and troponin at this time.

## 2020-10-06 NOTE — H&P (Addendum)
History and Physical    Amy Stanley QMG:[id number] DOB: Nov 07, 1935 DOA: 10/06/2020  PCP: Madelyn Brunner, MD   Patient coming from: Home  I have personally briefly reviewed patient's old medical records in [place]  Chief Complaint: " She passed out"  History is obtained from patient's son at the bedside.  Patient is unable to provide any history due to her dementia.  HPI: Amy Stanley is a 85 y.o. female with medical history significant for advanced dementia, hypertension and depression who was brought into the ER by EMS for evaluation of a witnessed syncopal episode.  Patient son who provides most of the history states that this morning while she was walking back from the bathroom she appeared shaky like she was going to fall.  He assisted her into a chair and provided her with something to eat because he thought she was dehydrated.  Patient was okay until about an hour later when he noticed that she was not responding like she normally would and then she appeared like she was going to fall.  He was able to assist her to the ground and then patient had a large loose bowel movement.  She was able to respond to him that he lowered her to the ground.   He called EMS and patient received 500 cc of normal saline in route to the hospital. I am unable to do a review of systems on this patient due to her dementia but the son states that she has had issues with " diarrhea" and was started on Imodium which eventually caused constipation for which she was treated with some stool softeners.  Patient son also states that they were told in the past that she had an abnormal heart rhythm and that she had followed up with a cardiologist for couple of years.  He is unsure what the rhythm was. Labs show sodium 141, potassium 3.6, chloride 116, bicarb 17, glucose 112, BUN 14, creatinine 0.86, calcium 7.9, alkaline phosphatase 35, albumin 2.9, AST 29, ALT 14, total protein 5.2, total bilirubin 0.9,  white count 6.6, hemoglobin 13.7, hematocrit 42.4, MCV 95.9, RDW 14.6, platelet count 250 Patient SARS coronavirus 2 point-of-care test is pending Urinalysis shows pyuria Initial twelve-lead EKG reviewed by me shows an irregular rhythm with multiple PACs   ED Course: Patient is an 85 year old Caucasian female who presents to the ER by EMS for evaluation of a witnessed syncopal episode.  Upon arrival to the ER she had an abnormal EKG and was tachycardic with heart rates in the 130's.  IV Cardizem was ordered for rapid A. Fib but patient converted to sinus rhythm prior to receiving the Cardizem.  She has pyuria and received a dose of IV Rocephin in the ER.  She will be admitted to the hospital for further evaluation.   Review of Systems: As per HPI otherwise all other systems reviewed and negative.    Past Medical History:  Diagnosis Date  . Hypertension     Past Surgical History:  Procedure Laterality Date  . ABDOMINAL HYSTERECTOMY    . CHOLECYSTECTOMY       reports that she has never smoked. She does not have any smokeless tobacco history on file. She reports that she does not drink alcohol. No history on file for drug use.  No Known Allergies  History reviewed. No pertinent family history.    Prior to Admission medications   Not on File    Physical Exam: Vitals:  10/06/20 1100 10/06/20 1130 10/06/20 1200 10/06/20 1230  BP: (!) 137/105 (!) 141/95 (!) 147/87 (!) 155/86  Pulse: (!) 112 (!) 146 (!) 111 81  Resp: 19 19 19 18   Temp:      TempSrc:      SpO2: 100% 100% 98% 100%  Weight:      Height:         Vitals:   10/06/20 1100 10/06/20 1130 10/06/20 1200 10/06/20 1230  BP: (!) 137/105 (!) 141/95 (!) 147/87 (!) 155/86  Pulse: (!) 112 (!) 146 (!) 111 81  Resp: 19 19 19 18   Temp:      TempSrc:      SpO2: 100% 100% 98% 100%  Weight:      Height:          Constitutional: Alert and oriented x 1 .  Appears comfortable and not in any apparent distress HEENT:       Head: Normocephalic and atraumatic.         Eyes: PERLA, EOMI, Conjunctivae are normal. Sclera is non-icteric.       Mouth/Throat: Mucous membranes are moist.       Neck: Supple with no signs of meningismus. Cardiovascular: Regular rate and rhythm. No murmurs, gallops, or rubs. 2+ symmetrical distal pulses are present . No JVD. No LE edema Respiratory: Respiratory effort normal .Lungs sounds clear bilaterally. No wheezes, crackles, or rhonchi.  Gastrointestinal: Soft, suprapubic tenderness, and non distended with positive bowel sounds.  Genitourinary: No CVA tenderness. Musculoskeletal: Nontender with normal range of motion in all extremities. No cyanosis, or erythema of extremities. Neurologic:  Face is symmetric. Moving all extremities. No gross focal neurologic deficits . Skin: Skin is warm, dry.  No rash or ulcers Psychiatric: Mood and affect are normal   Labs on Admission: I have personally reviewed following labs and imaging studies  CBC: Recent Labs  Lab 10/06/20 1020  WBC 6.6  HGB 13.7  HCT 42.4  MCV 95.9  PLT 267   Basic Metabolic Panel: Recent Labs  Lab 10/06/20 1020  NA 141  K 3.6  CL 116*  CO2 17*  GLUCOSE 112*  BUN 14  CREATININE 0.86  CALCIUM 7.9*   GFR: Estimated Creatinine Clearance: 39.9 mL/min (by C-G formula based on SCr of 0.86 mg/dL). Liver Function Tests: Recent Labs  Lab 10/06/20 1020  AST 29  ALT 14  ALKPHOS 35*  BILITOT 0.9  PROT 5.2*  ALBUMIN 2.9*   No results for input(s): LIPASE, AMYLASE in the last 168 hours. No results for input(s): AMMONIA in the last 168 hours. Coagulation Profile: No results for input(s): INR, PROTIME in the last 168 hours. Cardiac Enzymes: No results for input(s): CKTOTAL, CKMB, CKMBINDEX, TROPONINI in the last 168 hours. BNP (last 3 results) No results for input(s): PROBNP in the last 8760 hours. HbA1C: No results for input(s): HGBA1C in the last 72 hours. CBG: No results for input(s): GLUCAP in the  last 168 hours. Lipid Profile: No results for input(s): CHOL, HDL, LDLCALC, TRIG, CHOLHDL, LDLDIRECT in the last 72 hours. Thyroid Function Tests: No results for input(s): TSH, T4TOTAL, FREET4, T3FREE, THYROIDAB in the last 72 hours. Anemia Panel: No results for input(s): VITAMINB12, FOLATE, FERRITIN, TIBC, IRON, RETICCTPCT in the last 72 hours. Urine analysis:    Component Value Date/Time   COLORURINE YELLOW (A) 10/06/2020 1118   APPEARANCEUR TURBID (A) 10/06/2020 1118   LABSPEC 1.008 10/06/2020 1118   PHURINE 8.0 10/06/2020 1118   GLUCOSEU NEGATIVE 10/06/2020 1118  HGBUR MODERATE (A) 10/06/2020 1118   BILIRUBINUR NEGATIVE 10/06/2020 1118   [place] 10/06/2020 1118   PROTEINUR 100 (A) 10/06/2020 1118   NITRITE NEGATIVE 10/06/2020 1118   LEUKOCYTESUR LARGE (A) 10/06/2020 1118    Radiological Exams on Admission: No results found.   Assessment/Plan Principal Problem:   Syncope and collapse Active Problems:   Atrial fibrillation with rapid ventricular response (HCC)   Acute lower UTI   Dementia (HCC)     Syncope and collapse Unclear etiology ?? Vasovagal syncope Orthostatic blood pressure checks Place patient on a cardiac monitor to rule out arrhythmias Obtain 2D echocardiogram to assess LVEF We will consult cardiology   Urinary tract infection Patient has pyuria We will start patient empirically on Rocephin until urine culture results become available    Abnormal EKG Initially concerning for rapid A. fib but patient converted to sinus rhythm without intervention EKG was irregular with multiple PVCs.   Will request cardiology consult    Dementia Continue Namenda and Lexapro   Hypertension Continue Lisinopril   DVT prophylaxis: Lovenox Code Status: DNR Family Communication: Greater than 50% of time was spent discussing patient's condition and plan of care with her son, Flecia Shutter at the bedside.  All questions and concerns have been  addressed.  CODE STATUS was discussed and patient is a DNR Disposition Plan: Back to previous home environment Consults called: Cardiology Status: Inpatient.  At the time of admission, it appears the appropriate admission status for this patient.  This is judged to be reasonable and necessary in order to provide the required intensity of service to ensure the patient's safety given the presenting symptoms, discussed exam findings and initial radiographic and lab data in the context of their comorbid conditions. Patient requires inpatient status due to high intensity of service, high risk for further deterioration and high frequency of surveillance required. I certify that at the point of admission, it is my clinical judgment that the patient will require inpatient hospital care spanning beyond 2 midnights.    Collier Bullock MD [facility]     10/06/2020, 1:04 PM

## 2020-10-06 NOTE — ED Notes (Signed)
Informed RN bed assigned 

## 2020-10-06 NOTE — ED Notes (Signed)
Patient's son in hallway, asking this RN and other passing by staff when patient will be moved to floor. Patient's son becoming irate with this RN.  Charge RN notified and bedside. Floor nurse contacted about status of room ready so that patient can be transported to floor.

## 2020-10-06 NOTE — ED Provider Notes (Signed)
[facility] Emergency Department Provider Note   ____________________________________________    I have reviewed the triage vital signs and the nursing notes.   HISTORY  Chief Complaint Loss of Consciousness  History limited by dementia   HPI Amy Stanley is a 85 y.o. female who presents after a syncopal episode.  Son reports patient complained of not feeling well this morning, he noticed that she looked a little bit unsteady so he gave her some Gatorade.  She then stood up and started to faint, he was able to catch her.  She denies chest pain, no cough or shortness of breath reported.  He note that she has been more confused than typical and had a urinalysis a couple of weeks ago to check for UTI.  Past Medical History:  Diagnosis Date  . Hypertension     Patient Active Problem List   Diagnosis Date Noted  . Atrial fibrillation with rapid ventricular response ([place]) 10/06/2020  . Acute lower UTI 10/06/2020  . Syncope and collapse 10/06/2020  . Dementia ([place]) 10/06/2020    Past Surgical History:  Procedure Laterality Date  . ABDOMINAL HYSTERECTOMY    . CHOLECYSTECTOMY      Prior to Admission medications   Not on File     Allergies Patient has no known allergies.  History reviewed. No pertinent family history.  Social History Social History   Tobacco Use  . Smoking status: Never Smoker  Substance Use Topics  . Alcohol use: Never    Review of Systems limited by dementia  Constitutional: No fevers Eyes: No discharge ENT: No sore throat. Cardiovascular: Denies chest pain. Respiratory: Denies shortness of breath. Gastrointestinal: Diarrhea today Genitourinary: No reports of foul-smelling urine Musculoskeletal: Negative for back pain. Skin: Negative for rash. Neurological: Negative for focal weakness   ____________________________________________   PHYSICAL EXAM:  VITAL SIGNS: ED Triage Vitals  Enc Vitals Group      BP 10/06/20 1011 116/70     Pulse Rate 10/06/20 1011 (!) 121     Resp 10/06/20 1011 17     Temp 10/06/20 1011 97.6 F (36.4 C)     Temp Source 10/06/20 1011 Oral     SpO2 10/06/20 1011 100 %     Weight 10/06/20 1024 52.2 kg (115 lb)     Height 10/06/20 1024 1.575 m (5\' 2" )     Head Circumference --      Peak Flow --      Pain Score --      Pain Loc --      Pain Edu? --      Excl. in [place]? --     Constitutional: Alert, disoriented Eyes: Conjunctivae are normal.  Head: Atraumatic. Nose: No congestion/rhinnorhea. Mouth/Throat: Mucous membranes are moist.    Cardiovascular: Tachycardia, regular rhythm rhythm. Kermit Balo peripheral circulation. Respiratory: Normal respiratory effort.  No retractions.  Clear to auscultation Gastrointestinal: Soft and nontender. No distention.  No CVA tenderness.  Musculoskeletal: No lower extremity tenderness nor edema.  Warm and well perfused Neurologic:  Normal speech and language. No gross focal neurologic deficits are appreciated.  Normal range of motion of all extremities. Skin:  Skin is warm, dry and intact. No rash noted. Psychiatric: Mood and affect are normal. Speech and behavior are normal.  ____________________________________________   LABS (all labs ordered are listed, but only abnormal results are displayed)  Labs Reviewed  URINALYSIS, COMPLETE (UACMP) WITH MICROSCOPIC - Abnormal; Notable for the following components:  Result Value   Color, Urine YELLOW (*)    APPearance TURBID (*)    Hgb urine dipstick MODERATE (*)    Protein, ur 100 (*)    Leukocytes,Ua LARGE (*)    RBC / HPF >50 (*)    WBC, UA >50 (*)    Bacteria, UA MANY (*)    All other components within normal limits  COMPREHENSIVE METABOLIC PANEL - Abnormal; Notable for the following components:   Chloride 116 (*)    CO2 17 (*)    Glucose, Bld 112 (*)    Calcium 7.9 (*)    Total Protein 5.2 (*)    Albumin 2.9 (*)    Alkaline Phosphatase 35 (*)    All other  components within normal limits  TROPONIN I (HIGH SENSITIVITY) - Abnormal; Notable for the following components:   Troponin I (High Sensitivity) 29 (*)    All other components within normal limits  SARS CORONAVIRUS 2 (TAT 6-24 HRS)  CBC  TSH  CBG MONITORING, ED   ____________________________________________  EKG  ED ECG REPORT I, Lavonia Drafts, the attending physician, personally viewed and interpreted this ECG.  Date: 10/06/2020  Rhythm: Atrial fibrillation QRS Axis: normal Intervals: Abnormal ST/T Wave abnormalities: normal Narrative Interpretation: A. fib with RVR  ____________________________________________  RADIOLOGY  None ____________________________________________   PROCEDURES  Procedure(s) performed: No  Procedures   Critical Care performed: yes  CRITICAL CARE Performed by: Lavonia Drafts   Total critical care time: 30 minutes  Critical care time was exclusive of separately billable procedures and treating other patients.  Critical care was necessary to treat or prevent imminent or life-threatening deterioration.  Critical care was time spent personally by me on the following activities: development of treatment plan with patient and/or surrogate as well as nursing, discussions with consultants, evaluation of patient's response to treatment, examination of patient, obtaining history from patient or surrogate, ordering and performing treatments and interventions, ordering and review of laboratory studies, ordering and review of radiographic studies, pulse oximetry and re-evaluation of patient's condition.  ____________________________________________   INITIAL IMPRESSION / ASSESSMENT AND PLAN / ED COURSE  Pertinent labs & imaging results that were available during my care of the patient were reviewed by me and considered in my medical decision making (see chart for details).  Patient presents after syncopal episode.  Upon arrival noted to have  tachycardia with a regular rate, EKG consistent with A. fib with RVR.  Patient denies chest pain.  Lab work notable for mildly elevated troponin, bicarb of 17 with elevated chloride, urinalysis consistent with urinary tract infection.  Suspect A. fib with RVR as the cause of her syncopal episode, no record of A. fib as far as I can tell.  May have been precipitated by urinary tract infection.  Placed on Cardizem drip, IV Rocephin for urinary tract infection, discussed with the hospitalist for admission    ____________________________________________   FINAL CLINICAL IMPRESSION(S) / ED DIAGNOSES  Final diagnoses:  Atrial fibrillation with rapid ventricular response ([place])  Syncope and collapse  Lower urinary tract infectious disease        Note:  This document was prepared using Dragon voice recognition software and may include unintentional dictation errors.   Lavonia Drafts, MD 10/06/20 1330

## 2020-10-07 ENCOUNTER — Inpatient Hospital Stay: Payer: PPO

## 2020-10-07 LAB — CBC
HCT: 38.5 % (ref 36.0–46.0)
Hemoglobin: 12.8 g/dL (ref 12.0–15.0)
MCH: 31.1 pg (ref 26.0–34.0)
MCHC: 33.2 g/dL (ref 30.0–36.0)
MCV: 93.7 fL (ref 80.0–100.0)
Platelets: 201 10*3/uL (ref 150–400)
RBC: 4.11 MIL/uL (ref 3.87–5.11)
RDW: 14.6 % (ref 11.5–15.5)
WBC: 5.5 10*3/uL (ref 4.0–10.5)
nRBC: 0 % (ref 0.0–0.2)

## 2020-10-07 LAB — BASIC METABOLIC PANEL
Anion gap: 4 — ABNORMAL LOW (ref 5–15)
BUN: 12 mg/dL (ref 8–23)
CO2: 22 mmol/L (ref 22–32)
Calcium: 8.9 mg/dL (ref 8.9–10.3)
Chloride: 114 mmol/L — ABNORMAL HIGH (ref 98–111)
Creatinine, Ser: 0.75 mg/dL (ref 0.44–1.00)
GFR, Estimated: >60 mL/min (ref >60)
Glucose, Bld: 96 mg/dL (ref 70–99)
Potassium: 3.7 mmol/L (ref 3.5–5.1)
Sodium: 140 mmol/L (ref 135–145)

## 2020-10-07 LAB — MAGNESIUM: Magnesium: 2 mg/dL (ref 1.7–2.4)

## 2020-10-07 LAB — SARS CORONAVIRUS 2 (TAT 6-24 HRS): SARS Coronavirus 2: NEGATIVE

## 2020-10-07 MED ORDER — NITROFURANTOIN MONOHYD MACRO 100 MG PO CAPS
100.0000 mg | ORAL_CAPSULE | Freq: Two times a day (BID) | ORAL | Status: DC
  Administered 2020-10-07 – 2020-10-08 (×2): 100 mg via ORAL
  Filled 2020-10-07 (×5): qty 1

## 2020-10-07 MED ORDER — HALOPERIDOL LACTATE 5 MG/ML IJ SOLN: 1.0000 mg | Freq: Four times a day (QID) | INTRAMUSCULAR | Status: DC | PRN

## 2020-10-07 NOTE — Plan of Care (Signed)
  Problem: Education: Goal: Knowledge of General Education information will improve Description: Including pain rating scale, medication(s)/side effects and non-pharmacologic comfort measures Outcome: Progressing   Problem: Health Behavior/Discharge Planning: Goal: Ability to manage health-related needs will improve Outcome: Progressing   Problem: Clinical Measurements: Goal: Ability to maintain clinical measurements within normal limits will improve Outcome: Progressing Goal: Will remain free from infection Outcome: Progressing Goal: Diagnostic test results will improve Outcome: Progressing Goal: Respiratory complications will improve Outcome: Progressing Goal: Cardiovascular complication will be avoided Outcome: Progressing   Problem: Activity: Goal: Risk for activity intolerance will decrease Outcome: Progressing   Problem: Nutrition: Goal: Adequate nutrition will be maintained Outcome: Progressing   Problem: Coping: Goal: Level of anxiety will decrease Outcome: Progressing   Problem: Elimination: Goal: Will not experience complications related to bowel motility Outcome: Progressing Goal: Will not experience complications related to urinary retention Outcome: Progressing   Problem: Pain Managment: Goal: General experience of comfort will improve Outcome: Progressing   Problem: Safety: Goal: Ability to remain free from injury will improve Outcome: Progressing   Problem: Skin Integrity: Goal: Risk for impaired skin integrity will decrease Outcome: Progressing   Problem: Education: Goal: Knowledge of disease or condition will improve Outcome: Progressing Goal: Understanding of medication regimen will improve Outcome: Progressing Goal: Individualized Educational Video(s) Outcome: Progressing   Problem: Activity: Goal: Ability to tolerate increased activity will improve Outcome: Progressing   Problem: Cardiac: Goal: Ability to achieve and maintain  adequate cardiopulmonary perfusion will improve Outcome: Progressing   Problem: Health Behavior/Discharge Planning: Goal: Ability to safely manage health-related needs after discharge will improve Outcome: Progressing   Pt is AAOx2. Very pleasant but confused. Bed alarm is on. Denies any pain. VSS. [place] green. All needs attended. Call light is within reach. Safety measures maintained. Will continue to monitor.

## 2020-10-07 NOTE — Progress Notes (Signed)
[facility] Cardiology  SUBJECTIVE: Patient denies chest pain, shortness of breath, palpitations, or recurrent syncope   Vitals:   10/07/20 0825 10/07/20 1018 10/07/20 1019 10/07/20 1032  BP: (!) 165/83 (!) 160/90 (!) 152/90 (!) 94/56  Pulse: 74 79 78 96  Resp: 18     Temp: 97.7 F (36.5 C)     TempSrc: Oral     SpO2: 100% 99% 99% 99%  Weight:      Height:         Intake/Output Summary (Last 24 hours) at 10/07/2020 1048 Last data filed at 10/07/2020 0349 Gross per 24 hour  Intake 2401.2 ml  Output 1 ml  Net 2400.2 ml      PHYSICAL EXAM  General: Well developed, well nourished, in no acute distress HEENT:  Normocephalic and atramatic Neck:  No JVD.  Lungs: Clear bilaterally to auscultation and percussion. Heart: HRRR . Normal S1 and S2 without gallops or murmurs.  Abdomen: Bowel sounds are positive, abdomen soft and non-tender  Msk:  Back normal, normal gait. Normal strength and tone for age. Extremities: No clubbing, cyanosis or edema.   Neuro: Alert and oriented X 3. Psych:  Good affect, responds appropriately   LABS: Basic Metabolic Panel: Recent Labs    10/06/20 1020 10/07/20 0547  NA 141 140  K 3.6 3.7  CL 116* 114*  CO2 17* 22  GLUCOSE 112* 96  BUN 14 12  CREATININE 0.86 0.75  CALCIUM 7.9* 8.9  MG  --  2.0   Liver Function Tests: Recent Labs    10/06/20 1020  AST 29  ALT 14  ALKPHOS 35*  BILITOT 0.9  PROT 5.2*  ALBUMIN 2.9*   No results for input(s): LIPASE, AMYLASE in the last 72 hours. CBC: Recent Labs    10/06/20 1020 10/07/20 0547  WBC 6.6 5.5  HGB 13.7 12.8  HCT 42.4 38.5  MCV 95.9 93.7  PLT 215 201   Cardiac Enzymes: No results for input(s): CKTOTAL, CKMB, CKMBINDEX, TROPONINI in the last 72 hours. BNP: Invalid input(s): POCBNP D-Dimer: No results for input(s): DDIMER in the last 72 hours. Hemoglobin A1C: No results for input(s): HGBA1C in the last 72 hours. Fasting Lipid Panel: No results for input(s): CHOL, HDL, LDLCALC, TRIG,  CHOLHDL, LDLDIRECT in the last 72 hours. Thyroid Function Tests: Recent Labs    10/06/20 1020  TSH 1.737   Anemia Panel: No results for input(s): VITAMINB12, FOLATE, FERRITIN, TIBC, IRON, RETICCTPCT in the last 72 hours.  DG Chest Port 1 View  Result Date: 10/07/2020 CLINICAL DATA:  Syncopal episode. EXAM: PORTABLE CHEST 1 VIEW COMPARISON:  None. FINDINGS: The heart size and mediastinal contours are within normal limits. Both lungs are clear. The visualized skeletal structures are unremarkable. IMPRESSION: No active disease. Electronically Signed   By: Dorise Bullion III M.D   On: 10/07/2020 09:04     Echo pending  TELEMETRY: Sinus rhythm at 88 bpm:  ASSESSMENT AND PLAN:  Principal Problem:   Syncope and collapse Active Problems:   Atrial fibrillation with rapid ventricular response (HCC)   Acute lower UTI   Dementia ([place])    1.  Near syncope, without complete loss of consciousness, in the setting of atrial fibrillation with rapid ventricular rate 2.  Paroxysmal atrial fibrillation, converted to sinus rhythm with diltiazem IV bolus, remains in sinus rhythm on Cardizem CD 180 mg daily 3.  Essential hypertension, blood pressure mildly elevated 4.  Dementia 5.  UTI  Recommendations  1.  Agree with  overall current therapy 2.  Defer chronic anticoagulation at this time, may consider starting as outpatient with Dr. Nehemiah Massed, suggest low-dose Eliquis 2.5 mg twice daily 3.  Continue Cardizem CD 180 mg daily 4.  Review 2D echocardiogram 5.  Further recommendations pending echocardiogram results   Isaias Cowman, MD, PhD, [facility] 10/07/2020 10:48 AM

## 2020-10-07 NOTE — Progress Notes (Addendum)
PROGRESS NOTE    Amy Stanley  WNU:[id number] DOB: 1936/01/03 DOA: 10/06/2020 PCP: Madelyn Brunner, MD  Outpatient Specialists: [facility]    Brief Narrative:    Amy Stanley is a 85 y.o. female with medical history significant for advanced dementia, hypertension and depression who was brought into the ER by EMS for evaluation of a witnessed syncopal episode.  Patient son who provides most of the history states that this morning while she was walking back from the bathroom she appeared shaky like she was going to fall.  He assisted her into a chair and provided her with something to eat because he thought she was dehydrated.  Patient was okay until about an hour later when he noticed that she was not responding like she normally would and then she appeared like she was going to fall.  He was able to assist her to the ground and then patient had a large loose bowel movement.  She was able to respond to him that he lowered her to the ground.   He called EMS and patient received 500 cc of normal saline in route to the hospital. I am unable to do a review of systems on this patient due to her dementia but the son states that she has had issues with " diarrhea" and was started on Imodium which eventually caused constipation for which she was treated with some stool softeners.  Patient son also states that they were told in the past that she had an abnormal heart rhythm and that she had followed up with a cardiologist for couple of years.  He is unsure what the rhythm was.   Assessment & Plan:   Principal Problem:   Syncope and collapse Active Problems:   Atrial fibrillation with rapid ventricular response (HCC)   Acute lower UTI   Dementia (HCC)   # Paroxysmal atrial fibrillation # Syncope New onset a fib converted to sinus with IV bolus, now in normal sinus, asymptomatic overnight. TSH wnl. No dyspnea or hypoxia to suggest PE. chads2vasc 4. Trop mildly elevated @ 29, no chest  pain - f/u TTE - f/u CXR - cont dilt po - shared decision after discussion w/ son to defer anticoagulation given advanced dementia, as extending live not consistent w/ patient's wishes - f/u orthostats  # Bacteriuria Patient unable to give an accurate history but does report frequent urination. Started on ceftriaxone. No pyelo signs/symptoms - will order urine culture - stop ceftriaxone, start nitrofurantoin  # HTN Here bp elevated to 170 this morning - cont home lisinopril; dilt added as above  # Dementia # MDD # Insomnia # Delirium This morning agitated after room change, now resolved and sleeping comfortably - maintain sitter - Continue Namenda and Lexapro and trazodone - PT/OT consults  # Chronic diarrhea Followed by GI, w/u thus far unrevealing - gi pathogen panel if stools here    DVT prophylaxis: lovenox Code Status: dnr Family Communication: son updated @ bedside 3/6  Level of care: Progressive Cardiac Status is: Inpatient  Remains inpatient appropriate because:Inpatient level of care appropriate due to severity of illness   Dispo: The patient is from: Home              Anticipated d/c is to: tbd              Patient currently is not medically stable to d/c.   Difficult to place patient No        Consultants:  cardiology  Procedures: none  Antimicrobials:  none    Subjective: This morning sitting up in bed. Confused but redirectable. No complaints.  Objective: Vitals:   10/06/20 1430 10/06/20 1803 10/06/20 1956 10/07/20 0608  BP:  (!) 152/99 (!) 153/91 (!) 170/92  Pulse:  80 80 83  Resp:  16 20 17   Temp: 97.9 F (36.6 C) 97.9 F (36.6 C) 97.7 F (36.5 C) 98.1 F (36.7 C)  TempSrc: Oral  Oral Oral  SpO2:  97% 97% 100%  Weight:    57.5 kg  Height:        Intake/Output Summary (Last 24 hours) at 10/07/2020 0807 Last data filed at 10/06/2020 1800 Gross per 24 hour  Intake 1424.68 ml  Output 1 ml  Net 1423.68 ml   Filed Weights    10/06/20 1024 10/07/20 0608  Weight: 52.2 kg 57.5 kg    Examination:  General exam: Appears calm and comfortable  Respiratory system: Clear to auscultation. Respiratory effort normal. Cardiovascular system: S1 & S2 heard, RRR. No JVD, murmurs, rubs, gallops or clicks. No pedal edema. Gastrointestinal system: Abdomen is nondistended, soft and nontender. No organomegaly or masses felt. Normal bowel sounds heard. Central nervous system: Alert and oriented to self, knows is in hospital Extremities: Symmetric 5 x 5 power. Skin: No rashes, lesions or ulcers Psychiatry: easily confused    Data Reviewed: I have personally reviewed following labs and imaging studies  CBC: Recent Labs  Lab 10/06/20 1020 10/07/20 0547  WBC 6.6 5.5  HGB 13.7 12.8  HCT 42.4 38.5  MCV 95.9 93.7  PLT 215 151   Basic Metabolic Panel: Recent Labs  Lab 10/06/20 1020 10/07/20 0547  NA 141 140  K 3.6 3.7  CL 116* 114*  CO2 17* 22  GLUCOSE 112* 96  BUN 14 12  CREATININE 0.86 0.75  CALCIUM 7.9* 8.9   GFR: Estimated Creatinine Clearance: 42.9 mL/min (by C-G formula based on SCr of 0.75 mg/dL). Liver Function Tests: Recent Labs  Lab 10/06/20 1020  AST 29  ALT 14  ALKPHOS 35*  BILITOT 0.9  PROT 5.2*  ALBUMIN 2.9*   No results for input(s): LIPASE, AMYLASE in the last 168 hours. No results for input(s): AMMONIA in the last 168 hours. Coagulation Profile: No results for input(s): INR, PROTIME in the last 168 hours. Cardiac Enzymes: No results for input(s): CKTOTAL, CKMB, CKMBINDEX, TROPONINI in the last 168 hours. BNP (last 3 results) No results for input(s): PROBNP in the last 8760 hours. HbA1C: No results for input(s): HGBA1C in the last 72 hours. CBG: No results for input(s): GLUCAP in the last 168 hours. Lipid Profile: No results for input(s): CHOL, HDL, LDLCALC, TRIG, CHOLHDL, LDLDIRECT in the last 72 hours. Thyroid Function Tests: Recent Labs    10/06/20 1020  TSH 1.737    Anemia Panel: No results for input(s): VITAMINB12, FOLATE, FERRITIN, TIBC, IRON, RETICCTPCT in the last 72 hours. Urine analysis:    Component Value Date/Time   COLORURINE YELLOW (A) 10/06/2020 1118   APPEARANCEUR TURBID (A) 10/06/2020 1118   LABSPEC 1.008 10/06/2020 1118   PHURINE 8.0 10/06/2020 1118   GLUCOSEU NEGATIVE 10/06/2020 1118   HGBUR MODERATE (A) 10/06/2020 1118   BILIRUBINUR NEGATIVE 10/06/2020 1118   KETONESUR NEGATIVE 10/06/2020 1118   PROTEINUR 100 (A) 10/06/2020 1118   NITRITE NEGATIVE 10/06/2020 1118   LEUKOCYTESUR LARGE (A) 10/06/2020 1118   Sepsis Labs: @LABRCNTIP (procalcitonin:4,lacticidven:4)  ) Recent Results (from the past 240 hour(s))  SARS CORONAVIRUS 2 (TAT 6-24 HRS) Nasopharyngeal  Nasopharyngeal Swab     Status: None   Collection Time: 10/06/20 12:00 PM   Specimen: Nasopharyngeal Swab  Result Value Ref Range Status   SARS Coronavirus 2 NEGATIVE NEGATIVE Final    Comment: (NOTE) SARS-CoV-2 target nucleic acids are NOT DETECTED.  The SARS-CoV-2 RNA is generally detectable in upper and lower respiratory specimens during the acute phase of infection. Negative results do not preclude SARS-CoV-2 infection, do not rule out co-infections with other pathogens, and should not be used as the sole basis for treatment or other patient management decisions. Negative results must be combined with clinical observations, patient history, and epidemiological information. The expected result is Negative.  Fact Sheet for Patients: SugarRoll.be  Fact Sheet for Healthcare Providers: https://www.woods-mathews.com/  This test is not yet approved or cleared by the [place] FDA and  has been authorized for detection and/or diagnosis of SARS-CoV-2 by FDA under an Emergency Use Authorization (EUA). This EUA will remain  in effect (meaning this test can be used) for the duration of the COVID-19 declaration under Se ction  564(b)(1) of the Act, 21 U.S.C. section 360bbb-3(b)(1), unless the authorization is terminated or revoked sooner.  Performed at [facility], [place] [address] [place], [place] [address]          Radiology Studies: No results found.      Scheduled Meds: . diltiazem  180 mg Oral Daily  . enoxaparin (LOVENOX) injection  40 mg Subcutaneous Q24H  . escitalopram  10 mg Oral Daily  . lisinopril  5 mg Oral Daily  . memantine  5 mg Oral BID  . sodium chloride flush  3 mL Intravenous Q12H  . traZODone  50 mg Oral QHS   Continuous Infusions: . sodium chloride    . sodium chloride 100 mL/hr at 10/06/20 2355  . cefTRIAXone (ROCEPHIN)  IV       LOS: 1 day    Time spent: 80 min    Desma Maxim, MD [facility]   If 7PM-7AM, please contact night-coverage [email or web address] Password [email or web address] 10/07/2020, 8:07 AM

## 2020-10-07 NOTE — Evaluation (Addendum)
Physical Therapy Evaluation Patient Details Name: Amy Stanley MRN: [id number] DOB: 17-Sep-1935 Today's Date: 10/07/2020   History of Present Illness  Patient is an 85 year old Caucasian female who presents to the ER by EMS for evaluation of a witnessed syncopal episode.  Upon arrival to the ER she had an abnormal EKG and was tachycardic with heart rates in the 130's.  IV Cardizem was ordered for rapid A. Fib but patient converted to sinus rhythm prior to receiving the Cardizem.  She has pyuria and received a dose of IV Rocephin in the ER.  Pt admitted to the hospital for further evaluation. PMH: dementia, HTN, depression  Clinical Impression  Pt seen for PT evaluation with son present & providing home set up & PLOF information. Pt is independent without AD for household mobility & sundowns at home. Pt oriented to self but not situation during session. Pt is able to don tennis shoes with set up assist & supervision (pt with hx of club foot noted in LLE). Pt is able to ambulate increased distances with 1UE HHA CGA/min assist, perform toilet transfers & hand hygiene during session. Assisted pt with donning clean gown. Educated pt's son on recommendation of HHPT f/u & supervision at d/c. Will continue to follow acutely to progress safety with gait & balance training.   Pt without c/o adverse symptoms during session.    Follow Up Recommendations Home health PT;Supervision/Assistance - 24 hour;Supervision for mobility/OOB    Equipment Recommendations  3in1 (PT)    Recommendations for Other Services       Precautions / Restrictions Precautions Precautions: Fall Restrictions Weight Bearing Restrictions: No      Mobility  Bed Mobility Overal bed mobility: Needs Assistance Bed Mobility: Supine to Sit     Supine to sit: Supervision;HOB elevated     General bed mobility comments: use of bed rails    Transfers Overall transfer level: Needs assistance Equipment used: 1 person hand held  assist Transfers: Sit to/from Stand Sit to Stand: Min assist            Ambulation/Gait Ambulation/Gait assistance: Min assist;Min guard Gait Distance (Feet):  (100 ft + 200 ft) Assistive device: 1 person hand held assist Gait Pattern/deviations: Decreased step length - right;Decreased step length - left;Decreased stride length Gait velocity: decreased      Stairs            Wheelchair Mobility    Modified Rankin (Stroke Patients Only)       Balance Overall balance assessment: Needs assistance Sitting-balance support: Feet supported Sitting balance-Leahy Scale: Fair Sitting balance - Comments: supervision sitting on toilet     Standing balance-Leahy Scale: Fair Standing balance comment: CGA/min assist gait with 1 UE support                             Pertinent Vitals/Pain Pain Assessment: Faces Faces Pain Scale: No hurt    Home Living Family/patient expects to be discharged to:: Private residence Living Arrangements: Children Available Help at Discharge: Family;Available 24 hours/day Type of Home: House Home Access: Stairs to enter Entrance Stairs-Rails: Left Entrance Stairs-Number of Steps: 3 Home Layout: Multi-level Home Equipment: Shower seat;Cane - quad      Prior Function Level of Independence: Needs assistance   Gait / Transfers Assistance Needed: Independent for gait without AD, uses chair lift to go up/down to her suite  ADL's / Homemaking Assistance Needed: Assistance with stepping into tub but then  able to shower sitting on seat with HH shower, intermittent assistance with dressing dependent upon confusion        Hand Dominance        Extremity/Trunk Assessment   Upper Extremity Assessment Upper Extremity Assessment: Overall WFL for tasks assessed;Generalized weakness    Lower Extremity Assessment Lower Extremity Assessment: Overall WFL for tasks assessed;Generalized weakness       Communication    Communication: No difficulties  Cognition Arousal/Alertness: Awake/alert Behavior During Therapy: WFL for tasks assessed/performed Overall Cognitive Status: History of cognitive impairments - at baseline Area of Impairment: Orientation;Safety/judgement;Memory                 Orientation Level: Disoriented to;Place;Time;Situation   Memory: Decreased short-term memory   Safety/Judgement: Decreased awareness of safety;Decreased awareness of deficits     General Comments: Pleasantly confused      General Comments General comments (skin integrity, edema, etc.): incontinent of urine but also has continent void on toilet, performing peri hygiene without assistance, cuing for hand hygiene standing at sink    Exercises     Assessment/Plan    PT Assessment Patient needs continued PT services  PT Problem List Decreased strength;Decreased balance;Decreased knowledge of use of DME;Decreased safety awareness       PT Treatment Interventions DME instruction;Functional mobility training;Patient/family education;Balance training;Gait training;Therapeutic activities;Neuromuscular re-education;Manual techniques;Therapeutic exercise;Stair training    PT Goals (Current goals can be found in the Care Plan section)  Acute Rehab PT Goals Patient Stated Goal: get better, go home PT Goal Formulation: With patient/family Time For Goal Achievement: 10/21/20 Potential to Achieve Goals: Good    Frequency Min 2X/week   Barriers to discharge        Co-evaluation               AM-PAC PT "6 Clicks" Mobility  Outcome Measure Help needed turning from your back to your side while in a flat bed without using bedrails?: None Help needed moving from lying on your back to sitting on the side of a flat bed without using bedrails?: None Help needed moving to and from a bed to a chair (including a wheelchair)?: A Little Help needed standing up from a chair using your arms (e.g., wheelchair or  bedside chair)?: A Little Help needed to walk in hospital room?: A Little Help needed climbing 3-5 steps with a railing? : A Little 6 Click Score: 20    End of Session   Activity Tolerance: Patient tolerated treatment well Patient left:  (seated EOB in care of NT) Nurse Communication: Mobility status PT Visit Diagnosis: Unsteadiness on feet (R26.81);Muscle weakness (generalized) (M62.81)    Time: 5329-9242 PT Time Calculation (min) (ACUTE ONLY): 28 min   Charges:   PT Evaluation $PT Eval Low Complexity: 1 Low PT Treatments $Therapeutic Activity: 8-22 mins        Lavone Nian, PT, DPT 10/07/20, 2:07 PM   Waunita Schooner 10/07/2020, 2:04 PM

## 2020-10-08 ENCOUNTER — Inpatient Hospital Stay
Admit: 2020-10-08 | Discharge: 2020-10-08 | Disposition: A | Discharge status: Home / Self Care | Payer: PPO | Attending: Obstetrics and Gynecology | Admitting: Obstetrics and Gynecology

## 2020-10-08 LAB — ECHOCARDIOGRAM COMPLETE
AR max vel: 2.18 cm2
AV Area VTI: 2.24 cm2
AV Area mean vel: 2.04 cm2
AV Mean grad: 5 mmHg
AV Peak grad: 8.9 mmHg
Ao pk vel: 1.49 m/s
Area-P 1/2: 3.99 cm2
Height: 62 in
MV VTI: 2.53 cm2
S' Lateral: 3.2 cm
Weight: 2028.23 oz

## 2020-10-08 LAB — BASIC METABOLIC PANEL
Anion gap: 7 (ref 5–15)
BUN: 7 mg/dL — ABNORMAL LOW (ref 8–23)
CO2: 23 mmol/L (ref 22–32)
Calcium: 9.2 mg/dL (ref 8.9–10.3)
Chloride: 111 mmol/L (ref 98–111)
Creatinine, Ser: 0.59 mg/dL (ref 0.44–1.00)
GFR, Estimated: >60 mL/min (ref >60)
Glucose, Bld: 90 mg/dL (ref 70–99)
Potassium: 3.7 mmol/L (ref 3.5–5.1)
Sodium: 141 mmol/L (ref 135–145)

## 2020-10-08 LAB — URINE CULTURE

## 2020-10-08 MED ORDER — NITROFURANTOIN MONOHYD MACRO 100 MG PO CAPS: 100.0000 mg | ORAL_CAPSULE | Freq: Two times a day (BID) | ORAL | 0 refills | Status: DC

## 2020-10-08 MED ORDER — DILTIAZEM HCL ER COATED BEADS 180 MG PO CP24: 180.0000 mg | ORAL_CAPSULE | Freq: Every day | ORAL | 1 refills | Status: AC

## 2020-10-08 NOTE — TOC Initial Note (Signed)
Transition of Care [facility]) - Initial/Assessment Note    Patient Details  Name: Amy Stanley MRN: [id number] Date of Birth: Apr 03, 1936  Transition of Care [facility]) CM/SW Contact:    Beverly Sessions, RN Phone Number: 10/08/2020, 3:17 PM  Clinical Narrative:                 Patient admitted from home with afib Assessment completed via phone with son Patient lives at home with son  Son and daughter provide transport to an from appointments PCP [facility]-  Denies issues obtaining mediations  Patient has shower sear and cane in the home  PT has assessed patient and recommends home health and BSC. Son agreeable to home health services.  States he does not have a preference of home health agency.  Referral made and accepted by [facility] with [place].   Declines BSC, states he does not feel like the patient will use it at home.    Expected Discharge Plan: [place] Barriers to Discharge: No Barriers Identified   Patient Goals and CMS Choice        Expected Discharge Plan and Services Expected Discharge Plan: [place]       Living arrangements for the past 2 months: Single Family Home Expected Discharge Date: 10/08/20                         HH Arranged: RN,PT,OT [place] Agency: [place] ([place]) Date HH Agency Contacted: 10/08/20   Representative spoke with at [place]: Corene Cornea  Prior Living Arrangements/Services Living arrangements for the past 2 months: [place] Lives with:: Adult Children Patient language and need for interpreter reviewed:: Yes Do you feel safe going back to the place where you live?: Yes      Need for Family Participation in Patient Care: Yes (Comment) Care giver support system in place?: Yes (comment) Current home services: DME Criminal Activity/Legal Involvement Pertinent to Current Situation/Hospitalization: No - Comment as needed  Activities of Daily Living Home  Assistive Devices/Equipment: None ADL Screening (condition at time of admission) Patient's cognitive ability adequate to safely complete daily activities?: No Is the patient deaf or have difficulty hearing?: Yes Does the patient have difficulty seeing, even when wearing glasses/contacts?: Yes Does the patient have difficulty concentrating, remembering, or making decisions?: Yes Patient able to express need for assistance with ADLs?: No Does the patient have difficulty dressing or bathing?: Yes Independently performs ADLs?: No Communication: Needs assistance Is this a change from baseline?: Pre-admission baseline Dressing (OT): Needs assistance Is this a change from baseline?: Pre-admission baseline Grooming: Needs assistance Is this a change from baseline?: Pre-admission baseline Feeding: Needs assistance Is this a change from baseline?: Pre-admission baseline Bathing: Needs assistance Is this a change from baseline?: Pre-admission baseline Toileting: Needs assistance Is this a change from baseline?: Pre-admission baseline In/Out Bed: Needs assistance Is this a change from baseline?: Pre-admission baseline Walks in Home: Needs assistance Is this a change from baseline?: Pre-admission baseline Does the patient have difficulty walking or climbing stairs?: Yes Weakness of Legs: Both Weakness of Arms/Hands: Both  Permission Sought/Granted                  Emotional Assessment       Orientation: : Oriented to Self,Oriented to Place,Fluctuating Orientation (Suspected and/or reported Sundowners)   Psych Involvement: No (comment)  Admission diagnosis:  Syncope and collapse [R55] Lower urinary  tract infectious disease [N39.0] Atrial fibrillation with rapid ventricular response ([place]) [I48.91] Patient Active Problem List   Diagnosis Date Noted  . Atrial fibrillation with rapid ventricular response ([place]) 10/06/2020  . Acute lower UTI 10/06/2020  . Syncope and collapse  10/06/2020  . Dementia ([place]) 10/06/2020  . Hearing loss 09/25/2020  . Bradycardia 01/29/2017  . Hypertension 03/09/2014   PCP:  Baxter Hire, MD Pharmacy:   [id number] [place], [place] [address] [place] [address] Phone: [phone] Fax: [phone]     Social Determinants of Health (SDOH) Interventions    Readmission Risk Interventions No flowsheet data found.

## 2020-10-08 NOTE — Progress Notes (Signed)
*  PRELIMINARY RESULTS* Echocardiogram 2D Echocardiogram has been performed.  Amy Stanley 10/08/2020, 2:13 PM

## 2020-10-08 NOTE — Evaluation (Signed)
Occupational Therapy Evaluation Patient Details Name: Amy Stanley MRN: [id number] DOB: 07/30/1936 Today's Date: 10/08/2020    History of Present Illness Patient is an 85 year old Caucasian female who presents to the ER by EMS for evaluation of a witnessed syncopal episode.  Upon arrival to the ER she had an abnormal EKG and was tachycardic with heart rates in the 130's.  IV Cardizem was ordered for rapid A. Fib but patient converted to sinus rhythm prior to receiving the Cardizem.  She has pyuria and received a dose of IV Rocephin in the ER.  Pt admitted to the hospital for further evaluation. PMH: dementia, HTN, depression   Clinical Impression   Pt seen for OT evaluation this date. Upon arrival to room, pt seated upright in bed with daughter at bedside. Pt reporting no pain and agreeable to evaluation. Of note, pt pleasantly confused, unable to remember her name, however able to state name of daughter at beside. Pt's daughter was able to confirm all PLOF provided by pt. Prior to admission, pt was living with her son and receiving some assistance for BADLs when confused. Prior to admission, pt was performing functional mobility without AD and using a chair lift to go up/down to her in-law suite. Pt currently requires SUPERVISION for seated UB/LB dressing and seated frontal peri-care. Pt currently require SUPERVISION-MIN A for bed mobility and transfers. Of note, pt orthostatic with OOB mobility; orthostatics: Supine: 169/90  Sitting: 171/96  Standing: 142/92. Pt c/o dizziness while seated EOB following supine>sit transfer. Pt would benefit from additional skilled OT services to maximize return to PLOF and minimize risk of future falls, injury, caregiver burden, and readmission. Upon discharge, recommend HHOT services and 24/7 supervision from family.     Follow Up Recommendations  Home health OT;Supervision/Assistance - 24 hour    Equipment Recommendations  3 in 1 bedside commode        Precautions / Restrictions Precautions Precautions: Fall Restrictions Weight Bearing Restrictions: No Other Position/Activity Restrictions: Orthostatic      Mobility Bed Mobility Overal bed mobility: Needs Assistance Bed Mobility: Supine to Sit;Sit to Supine     Supine to sit: Min assist;HOB elevated Sit to supine: Supervision   General bed mobility comments: MIN A via hand held assist during supine>sit transfer    Transfers Overall transfer level: Needs assistance Equipment used: 1 person hand held assist Transfers: Sit to/from Stand Sit to Stand: Min assist         General transfer comment: MIN A for initial steadying upon standing    Balance Overall balance assessment: Needs assistance Sitting-balance support: No upper extremity supported;Feet unsupported Sitting balance-Leahy Scale: Good Sitting balance - Comments: Pt able to don/doff socks while sitting EOB; no LOB observed     Standing balance-Leahy Scale: Fair Standing balance comment: CGA/min assist with 1 UE support while standing                           ADL either performed or assessed with clinical judgement   ADL Overall ADL's : Needs assistance/impaired                 Upper Body Dressing : Supervision/safety;Set up;Sitting Upper Body Dressing Details (indicate cue type and reason): to don/doff hospital gown Lower Body Dressing: Supervision/safety;Set up;Sit to/from stand;Sitting/lateral leans Lower Body Dressing Details (indicate cue type and reason): To doff brief sit>stand and don/doff socks while seated     Toileting- Clothing Manipulation and Hygiene: Supervision/safety;Set  up;Sitting/lateral lean Toileting - Clothing Manipulation Details (indicate cue type and reason): Able to perfrom frontal peri-care             Vision Baseline Vision/History: Wears glasses Wears Glasses: At all times              Pertinent Vitals/Pain Pain Assessment: No/denies pain         Extremity/Trunk Assessment Upper Extremity Assessment Upper Extremity Assessment: Overall WFL for tasks assessed   Lower Extremity Assessment Lower Extremity Assessment: Overall WFL for tasks assessed       Communication Communication Communication: No difficulties   Cognition Arousal/Alertness: Awake/alert Behavior During Therapy: WFL for tasks assessed/performed Overall Cognitive Status: History of cognitive impairments - at baseline                                 General Comments: Pt agreeable to session and pleasantly confused. Disoriented to self. Able to state name of daughter at beside, however stated that her daughter was her mother   General Comments  Orthostatics: Supine: 169/90  Sitting: 171/96  Standing: 142/92. Pt c/o dizziness while seated EOB following supine>sit transfer            Home Living Family/patient expects to be discharged to:: Private residence Living Arrangements: Children Available Help at Discharge: Family;Available 24 hours/day Type of Home: House Home Access: Stairs to enter [employer] of Steps: 3 Entrance Stairs-Rails: Left Home Layout: Multi-level Alternate Level Stairs-Number of Steps: pt has a mother in Museum/gallery curator down stairs with chair lift to access   Bathroom Shower/Tub: Occupational psychologist: Standard     Home Equipment: Shower seat;Cane - quad;Walker - 2 wheels   Additional Comments: has a walker and cane but does not uses      Prior Functioning/Environment Level of Independence: Needs assistance  Gait / Transfers Assistance Needed: Independent for gait without AD, uses chair lift to go up/down to her suite ADL's / Homemaking Assistance Needed: Assistance with stepping into tub but then able to shower sitting on seat with HH shower, intermittent assistance with dressing dependent upon confusion            OT Problem List: Decreased strength;Decreased activity  tolerance;Impaired balance (sitting and/or standing);Decreased cognition;Decreased safety awareness;Decreased knowledge of precautions      OT Treatment/Interventions: Self-care/ADL training;Therapeutic exercise;DME and/or AE instruction;Therapeutic activities;Patient/family education;Balance training    OT Goals(Current goals can be found in the care plan section) Acute Rehab OT Goals Patient Stated Goal: to go home OT Goal Formulation: With patient/family Time For Goal Achievement: 10/22/20 Potential to Achieve Goals: Good ADL Goals Pt Will Perform Grooming: with modified independence;standing Pt Will Transfer to Toilet: with modified independence;ambulating;regular height toilet Pt Will Perform Toileting - Clothing Manipulation and hygiene: with modified independence;sit to/from stand  OT Frequency: Min 1X/week    AM-PAC OT "6 Clicks" Daily Activity     Outcome Measure Help from another person eating meals?: None Help from another person taking care of personal grooming?: A Little Help from another person toileting, which includes using toliet, bedpan, or urinal?: A Little Help from another person bathing (including washing, rinsing, drying)?: A Little Help from another person to put on and taking off regular upper body clothing?: A Little Help from another person to put on and taking off regular lower body clothing?: A Little 6 Click Score: 19   End of Session Equipment Utilized During Treatment:  Gait belt;Rolling walker Nurse Communication: Mobility status  Activity Tolerance: Patient tolerated treatment well Patient left: in bed;with call bell/phone within reach;with bed alarm set;with family/visitor present  OT Visit Diagnosis: Unsteadiness on feet (R26.81);Muscle weakness (generalized) (M62.81);History of falling (Z91.81)                Time: 1220-1300 OT Time Calculation (min): 40 min Charges:  OT General Charges $OT Visit: 1 Visit OT Evaluation $OT Eval Moderate  Complexity: 1 Mod OT Treatments $Self Care/Home Management : 23-37 mins  Fredirick Maudlin, OTR/L [place]

## 2020-10-08 NOTE — Discharge Summary (Signed)
Amy Stanley:[id number] DOB: Oct 29, 1935 DOA: 10/06/2020  PCP: Baxter Hire, MD  Admit date: 10/06/2020 Discharge date: 10/08/2020  Time spent: 35 minutes  Recommendations for Outpatient Follow-up:  1. Cardiology f/u, will need to review echocardiogram results (performed, read pending at time of discharge)     Discharge Diagnoses:  Principal Problem:   Syncope and collapse Active Problems:   Atrial fibrillation with rapid ventricular response ([place])   Acute lower UTI   Dementia [facility])   Discharge Condition: fair  Diet recommendation: regular  Filed Weights   10/06/20 1024 10/07/20 0608  Weight: 52.2 kg 57.5 kg    History of present illness:   Amy Stanley is a 85 y.o. female with medical history significant for advanced dementia, hypertension and depression who was brought into the ER by EMS for evaluation of a witnessed syncopal episode.  Patient son who provides most of the history states that this morning while she was walking back from the bathroom she appeared shaky like she was going to fall.  He assisted her into a chair and provided her with something to eat because he thought she was dehydrated.  Patient was okay until about an hour later when he noticed that she was not responding like she normally would and then she appeared like she was going to fall.  He was able to assist her to the ground and then patient had a large loose bowel movement.  She was able to respond to him that he lowered her to the ground.   He called EMS and patient received 500 cc of normal saline in route to the hospital. I am unable to do a review of systems on this patient due to her dementia but the son states that she has had issues with " diarrhea" and was started on Imodium which eventually caused constipation for which she was treated with some stool softeners.  Patient son also states that they were told in the past that she had an abnormal heart rhythm and that she had followed up with a  cardiologist for couple of years.  He is unsure what the rhythm was.  ED Course: Patient is an 85 year old Caucasian female who presents to the ER by EMS for evaluation of a witnessed syncopal episode.  Upon arrival to the ER she had an abnormal EKG and was tachycardic with heart rates in the 130's.  IV Cardizem was ordered for rapid A. Fib but patient converted to sinus rhythm prior to receiving the Cardizem.  She has pyuria and received a dose of IV Rocephin in the ER.  She will be admitted to the hospital for further evaluation.  Hospital Course:  # Paroxysmal atrial fibrillation # Syncope # Orthostasis New onset a fib converted to sinus with IV bolus, now in normal sinus, asymptomatic overnight. TSH wnl. No dyspnea or hypoxia to suggest PE. chads2vasc 4. Trop mildly elevated @ 29, no chest pain. cxr unremarkable. orthostats positive - f/u TTE, performed but result pending - cont dilt po, stop acei - shared decision after discussion w/ son to defer anticoagulation given advanced dementia, as extending live not consistent w/ patient's wishes. Dan discuss further w/ primary cardiologist at f/u - discussed with son non-pharmacologic approaches to orthostasis, can f/u with cardiology, consider adding pharmacologic agent as needed  # Bacteriuria Patient unable to give an accurate history but does report frequent urination. Started on ceftriaxone. No pyelo signs/symptoms. Urine culture mixed flora - finish course macrobid  # HTN Here  bp elevated to 161 this morning - stopped home acei, started dilt po  # Dementia # MDD # Insomnia # Delirium One episode of agitation, resolved w/ redirection - Continued Namendaand Lexapro and trazodone - PT/OT not advising snf  # Chronic diarrhea Followed by GI, w/u thus far unrevealing - gi pathogen panel if stools here. Hasn't stooled  Procedures:  TTE   Consultations:  cardiology  Discharge Exam: Vitals:   10/08/20 0757 10/08/20 1120   BP: (!) 161/71 (!) 164/81  Pulse: 74 74  Resp: 18 20  Temp: 97.8 F (36.6 C) 98.3 F (36.8 C)  SpO2: 100% 98%    General exam: Appears calm and comfortable  Respiratory system: Clear to auscultation. Respiratory effort normal. Cardiovascular system: S1 & S2 heard, RRR. No JVD, murmurs, rubs, gallops or clicks. No pedal edema. Gastrointestinal system: Abdomen is nondistended, soft and nontender. No organomegaly or masses felt. Normal bowel sounds heard. Central nervous system: Alert and oriented to self, knows is in hospital Extremities: Symmetric 5 x 5 power. Skin: No rashes, lesions or ulcers Psychiatry: easily confused  Discharge Instructions   Discharge Instructions    Diet - low sodium heart healthy   Complete by: As directed    Increase activity slowly   Complete by: As directed      Allergies as of 10/08/2020   No Known Allergies     Medication List    STOP taking these medications   lisinopril 5 MG tablet Commonly known as: ZESTRIL     TAKE these medications   cholestyramine 4 g packet Commonly known as: QUESTRAN Take 4 g by mouth daily.   diltiazem 180 MG 24 hr capsule Commonly known as: CARDIZEM CD Take 1 capsule (180 mg total) by mouth daily. Start taking on: October 09, 2020   donepezil 10 MG tablet Commonly known as: ARICEPT Take 10 mg by mouth at bedtime.   escitalopram 10 MG tablet Commonly known as: LEXAPRO Take 10 mg by mouth daily.   memantine 5 MG tablet Commonly known as: NAMENDA Take 5 mg by mouth 2 (two) times daily.   nitrofurantoin (macrocrystal-monohydrate) 100 MG capsule Commonly known as: MACROBID Take 1 capsule (100 mg total) by mouth 2 (two) times daily.   traZODone 50 MG tablet Commonly known as: DESYREL Take 50 mg by mouth at bedtime.      No Known Allergies  Follow-up Information    Corey Skains, MD. Schedule an appointment as soon as possible for a visit.   Specialty: Cardiology Contact information: [address] [facility] [place] [address] [phone]                The results of significant diagnostics from this hospitalization (including imaging, microbiology, ancillary and laboratory) are listed below for reference.    Significant Diagnostic Studies: DG Chest Port 1 View  Result Date: 10/07/2020 CLINICAL DATA:  Syncopal episode. EXAM: PORTABLE CHEST 1 VIEW COMPARISON:  None. FINDINGS: The heart size and mediastinal contours are within normal limits. Both lungs are clear. The visualized skeletal structures are unremarkable. IMPRESSION: No active disease. Electronically Signed   By: Dorise Bullion III M.D   On: 10/07/2020 09:04    Microbiology: Recent Results (from the past 240 hour(s))  Urine Culture     Status: Abnormal   Collection Time: 10/06/20 11:18 AM   Specimen: Urine, Random  Result Value Ref Range Status   Specimen Description   Final    URINE, RANDOM Performed  at [facility], [address] [place], [place] [address]    Special Requests   Final    NONE Performed at [facility], [address] [place], [place] [address]    Culture MULTIPLE SPECIES PRESENT, SUGGEST RECOLLECTION (A)  Final   Report Status 10/08/2020 FINAL  Final  SARS CORONAVIRUS 2 (TAT 6-24 HRS) Nasopharyngeal Nasopharyngeal Swab     Status: None   Collection Time: 10/06/20 12:00 PM   Specimen: Nasopharyngeal Swab  Result Value Ref Range Status   SARS Coronavirus 2 NEGATIVE NEGATIVE Final    Comment: (NOTE) SARS-CoV-2 target nucleic acids are NOT DETECTED.  The SARS-CoV-2 RNA is generally detectable in upper and lower respiratory specimens during the acute phase of infection. Negative results do not preclude SARS-CoV-2 infection, do not rule out co-infections with other pathogens, and should not be used as the sole basis for treatment or other patient management decisions. Negative results must be combined with clinical  observations, patient history, and epidemiological information. The expected result is Negative.  Fact Sheet for Patients: SugarRoll.be  Fact Sheet for Healthcare Providers: https://www.woods-mathews.com/  This test is not yet approved or cleared by the [place] FDA and  has been authorized for detection and/or diagnosis of SARS-CoV-2 by FDA under an Emergency Use Authorization (EUA). This EUA will remain  in effect (meaning this test can be used) for the duration of the COVID-19 declaration under Se ction 564(b)(1) of the Act, 21 U.S.C. section 360bbb-3(b)(1), unless the authorization is terminated or revoked sooner.  Performed at [facility], [place] [address] [place], [place] [address]      Labs: Basic Metabolic Panel: Recent Labs  Lab 10/06/20 1020 10/07/20 0547 10/08/20 0508  NA 141 140 141  K 3.6 3.7 3.7  CL 116* 114* 111  CO2 17* 22 23  GLUCOSE 112* 96 90  BUN 14 12 7*  CREATININE 0.86 0.75 0.59  CALCIUM 7.9* 8.9 9.2  MG  --  2.0  --    Liver Function Tests: Recent Labs  Lab 10/06/20 1020  AST 29  ALT 14  ALKPHOS 35*  BILITOT 0.9  PROT 5.2*  ALBUMIN 2.9*   No results for input(s): LIPASE, AMYLASE in the last 168 hours. No results for input(s): AMMONIA in the last 168 hours. CBC: Recent Labs  Lab 10/06/20 1020 10/07/20 0547  WBC 6.6 5.5  HGB 13.7 12.8  HCT 42.4 38.5  MCV 95.9 93.7  PLT 215 201   Cardiac Enzymes: No results for input(s): CKTOTAL, CKMB, CKMBINDEX, TROPONINI in the last 168 hours. BNP: BNP (last 3 results) No results for input(s): BNP in the last 8760 hours.  ProBNP (last 3 results) No results for input(s): PROBNP in the last 8760 hours.  CBG: No results for input(s): GLUCAP in the last 168 hours.     Signed:  Desma Maxim MD.  [facility] 10/08/2020, 2:54 PM

## 2020-10-08 NOTE — Progress Notes (Signed)
[facility]    SUBJECTIVE: The patient is resting in bed, appears comfortable.   Vitals:   10/07/20 1550 10/07/20 2333 10/08/20 0435 10/08/20 0757  BP: (!) 152/79 (!) 86/53 (!) 161/86 (!) 161/71  Pulse: 72 (!) 59 86 74  Resp: 17 20 16 18   Temp: 98 F (36.7 C) 97.6 F (36.4 C) 98 F (36.7 C) 97.8 F (36.6 C)  TempSrc: Oral Oral  Oral  SpO2: 98% 100% 100% 100%  Weight:      Height:         Intake/Output Summary (Last 24 hours) at 10/08/2020 1829 Last data filed at 10/08/2020 9371 Gross per 24 hour  Intake 2003.5 ml  Output 1400 ml  Net 603.5 ml      PHYSICAL EXAM  General: Well developed, well nourished, elderly female sleepign in bed, lying flat, appears comfortable, in no acute distress HEENT:  Normocephalic and atramatic Neck:  No JVD.  Lungs: Clear bilaterally to auscultation, normal effort of breathing on RA. Heart: HRRR . Normal S1 and S2 without gallops or murmurs.  Extremities: No clubbing, cyanosis or edema.   Neuro: somnolent, aroused to noxious stimuli   LABS: Basic Metabolic Panel: Recent Labs    10/07/20 0547 10/08/20 0508  NA 140 141  K 3.7 3.7  CL 114* 111  CO2 22 23  GLUCOSE 96 90  BUN 12 7*  CREATININE 0.75 0.59  CALCIUM 8.9 9.2  MG 2.0  --    Liver Function Tests: Recent Labs    10/06/20 1020  AST 29  ALT 14  ALKPHOS 35*  BILITOT 0.9  PROT 5.2*  ALBUMIN 2.9*   No results for input(s): LIPASE, AMYLASE in the last 72 hours. CBC: Recent Labs    10/06/20 1020 10/07/20 0547  WBC 6.6 5.5  HGB 13.7 12.8  HCT 42.4 38.5  MCV 95.9 93.7  PLT 215 201   Cardiac Enzymes: No results for input(s): CKTOTAL, CKMB, CKMBINDEX, TROPONINI in the last 72 hours. BNP: Invalid input(s): POCBNP D-Dimer: No results for input(s): DDIMER in the last 72 hours. Hemoglobin A1C: No results for input(s): HGBA1C in the last 72 hours. Fasting Lipid Panel: No results for input(s): CHOL, HDL, LDLCALC, TRIG, CHOLHDL, LDLDIRECT in the last 72  hours. Thyroid Function Tests: Recent Labs    10/06/20 1020  TSH 1.737   Anemia Panel: No results for input(s): VITAMINB12, FOLATE, FERRITIN, TIBC, IRON, RETICCTPCT in the last 72 hours.  DG Chest Port 1 View  Result Date: 10/07/2020 CLINICAL DATA:  Syncopal episode. EXAM: PORTABLE CHEST 1 VIEW COMPARISON:  None. FINDINGS: The heart size and mediastinal contours are within normal limits. Both lungs are clear. The visualized skeletal structures are unremarkable. IMPRESSION: No active disease. Electronically Signed   By: Dorise Bullion III M.D   On: 10/07/2020 09:04     Echo pending  TELEMETRY: sinus rhythm, 70s  ASSESSMENT AND PLAN:  Principal Problem:   Syncope and collapse Active Problems:   Atrial fibrillation with rapid ventricular response ([place])   Acute lower UTI   Dementia ([place])    1. Near syncope, without complete loss of consciousness, in the setting of atrial fibrillation with rapid ventricular rate 2.Paroxysmal atrial fibrillation, converted to sinus rhythm with diltiazem IV bolus, remains in sinus rhythm on Cardizem CD 180 mg daily 3.Essential hypertension, blood pressure mildly elevated 4.  Dementia 5.  UTI, on antibiotics  Recommendations  1.Agree with overall current therapy 2.Defer chronic anticoagulation at this time, may consider starting as  outpatient with Dr. Nehemiah Massed, suggest low-dose Eliquis 2.5 mg twice daily 3.Continue Cardizem CD 180 mg daily 4.Review 2D echocardiogram   Clabe Seal, PA-C 10/08/2020 9:06 AM

## 2020-10-08 NOTE — Progress Notes (Signed)
PROGRESS NOTE    Amy Stanley  GNF:[id number] DOB: 1935-10-13 DOA: 10/06/2020 PCP: Madelyn Brunner, MD  Outpatient Specialists: [facility]    Brief Narrative:    Amy Stanley is a 85 y.o. female with medical history significant for advanced dementia, hypertension and depression who was brought into the ER by EMS for evaluation of a witnessed syncopal episode.  Patient son who provides most of the history states that this morning while she was walking back from the bathroom she appeared shaky like she was going to fall.  He assisted her into a chair and provided her with something to eat because he thought she was dehydrated.  Patient was okay until about an hour later when he noticed that she was not responding like she normally would and then she appeared like she was going to fall.  He was able to assist her to the ground and then patient had a large loose bowel movement.  She was able to respond to him that he lowered her to the ground.   He called EMS and patient received 500 cc of normal saline in route to the hospital. I am unable to do a review of systems on this patient due to her dementia but the son states that she has had issues with " diarrhea" and was started on Imodium which eventually caused constipation for which she was treated with some stool softeners.  Patient son also states that they were told in the past that she had an abnormal heart rhythm and that she had followed up with a cardiologist for couple of years.  He is unsure what the rhythm was.   Assessment & Plan:   Principal Problem:   Syncope and collapse Active Problems:   Atrial fibrillation with rapid ventricular response (HCC)   Acute lower UTI   Dementia (HCC)   # Paroxysmal atrial fibrillation # Syncope # Orthostasis New onset a fib converted to sinus with IV bolus, now in normal sinus, asymptomatic overnight. TSH wnl. No dyspnea or hypoxia to suggest PE. chads2vasc 4. Trop mildly elevated  @ 29, no chest pain. cxr unremarkable. orthostats positive - f/u TTE - cont dilt po - shared decision after discussion w/ son to defer anticoagulation given advanced dementia, as extending live not consistent w/ patient's wishes - will discuss florinef w/ cardiology  # Bacteriuria Patient unable to give an accurate history but does report frequent urination. Started on ceftriaxone. No pyelo signs/symptoms. Urine culture mixed flora - stopped ceftriaxone, will finish course nitrofurantoin  # HTN Here bp elevated to 161 this morning - cont home lisinopril; dilt added as above  # Dementia # MDD # Insomnia # Delirium Agitation resolved - Continue Namenda and Lexapro and trazodone - PT/OT not advising snf  # Chronic diarrhea Followed by GI, w/u thus far unrevealing - gi pathogen panel if stools here. Hasn't stooled    DVT prophylaxis: lovenox Code Status: dnr Family Communication: son updated 3/7  Level of care: Med-Surg Status is: Inpatient  Remains inpatient appropriate because:Inpatient level of care appropriate due to severity of illness   Dispo: The patient is from: Home              Anticipated d/c is to: tbd              Patient currently is not medically stable to d/c.   Difficult to place patient No        Consultants:  cardiology  Procedures: none  Antimicrobials:  none    Subjective: This morning sitting up in bed. Confused but redirectable. No complaints.  Objective: Vitals:   10/07/20 1550 10/07/20 2333 10/08/20 0435 10/08/20 0757  BP: (!) 152/79 (!) 86/53 (!) 161/86 (!) 161/71  Pulse: 72 (!) 59 86 74  Resp: 17 20 16 18   Temp: 98 F (36.7 C) 97.6 F (36.4 C) 98 F (36.7 C) 97.8 F (36.6 C)  TempSrc: Oral Oral  Oral  SpO2: 98% 100% 100% 100%  Weight:      Height:        Intake/Output Summary (Last 24 hours) at 10/08/2020 1038 Last data filed at 10/08/2020 7209 Gross per 24 hour  Intake 1026.98 ml  Output 1400 ml  Net -373.02 ml    Filed Weights   10/06/20 1024 10/07/20 0608  Weight: 52.2 kg 57.5 kg    Examination:  General exam: Appears calm and comfortable  Respiratory system: Clear to auscultation. Respiratory effort normal. Cardiovascular system: S1 & S2 heard, RRR. No JVD, murmurs, rubs, gallops or clicks. No pedal edema. Gastrointestinal system: Abdomen is nondistended, soft and nontender. No organomegaly or masses felt. Normal bowel sounds heard. Central nervous system: Alert and oriented to self, knows is in hospital Extremities: Symmetric 5 x 5 power. Skin: No rashes, lesions or ulcers Psychiatry: easily confused    Data Reviewed: I have personally reviewed following labs and imaging studies  CBC: Recent Labs  Lab 10/06/20 1020 10/07/20 0547  WBC 6.6 5.5  HGB 13.7 12.8  HCT 42.4 38.5  MCV 95.9 93.7  PLT 215 470   Basic Metabolic Panel: Recent Labs  Lab 10/06/20 1020 10/07/20 0547 10/08/20 0508  NA 141 140 141  K 3.6 3.7 3.7  CL 116* 114* 111  CO2 17* 22 23  GLUCOSE 112* 96 90  BUN 14 12 7*  CREATININE 0.86 0.75 0.59  CALCIUM 7.9* 8.9 9.2  MG  --  2.0  --    GFR: Estimated Creatinine Clearance: 42.9 mL/min (by C-G formula based on SCr of 0.59 mg/dL). Liver Function Tests: Recent Labs  Lab 10/06/20 1020  AST 29  ALT 14  ALKPHOS 35*  BILITOT 0.9  PROT 5.2*  ALBUMIN 2.9*   No results for input(s): LIPASE, AMYLASE in the last 168 hours. No results for input(s): AMMONIA in the last 168 hours. Coagulation Profile: No results for input(s): INR, PROTIME in the last 168 hours. Cardiac Enzymes: No results for input(s): CKTOTAL, CKMB, CKMBINDEX, TROPONINI in the last 168 hours. BNP (last 3 results) No results for input(s): PROBNP in the last 8760 hours. HbA1C: No results for input(s): HGBA1C in the last 72 hours. CBG: No results for input(s): GLUCAP in the last 168 hours. Lipid Profile: No results for input(s): CHOL, HDL, LDLCALC, TRIG, CHOLHDL, LDLDIRECT in the last 72  hours. Thyroid Function Tests: Recent Labs    10/06/20 1020  TSH 1.737   Anemia Panel: No results for input(s): VITAMINB12, FOLATE, FERRITIN, TIBC, IRON, RETICCTPCT in the last 72 hours. Urine analysis:    Component Value Date/Time   COLORURINE YELLOW (A) 10/06/2020 1118   APPEARANCEUR TURBID (A) 10/06/2020 1118   LABSPEC 1.008 10/06/2020 1118   PHURINE 8.0 10/06/2020 1118   GLUCOSEU NEGATIVE 10/06/2020 1118   HGBUR MODERATE (A) 10/06/2020 1118   BILIRUBINUR NEGATIVE 10/06/2020 1118   KETONESUR NEGATIVE 10/06/2020 1118   PROTEINUR 100 (A) 10/06/2020 1118   NITRITE NEGATIVE 10/06/2020 1118   LEUKOCYTESUR LARGE (A) 10/06/2020 1118   Sepsis Labs: @LABRCNTIP (procalcitonin:4,lacticidven:4)  )  Recent Results (from the past 240 hour(s))  Urine Culture     Status: Abnormal   Collection Time: 10/06/20 11:18 AM   Specimen: Urine, Random  Result Value Ref Range Status   Specimen Description   Final    URINE, RANDOM Performed at [facility], [address] [place], [place] [address]    Special Requests   Final    NONE Performed at [facility], [address] [place], [place] [address]    Culture MULTIPLE SPECIES PRESENT, SUGGEST RECOLLECTION (A)  Final   Report Status 10/08/2020 FINAL  Final  SARS CORONAVIRUS 2 (TAT 6-24 HRS) Nasopharyngeal Nasopharyngeal Swab     Status: None   Collection Time: 10/06/20 12:00 PM   Specimen: Nasopharyngeal Swab  Result Value Ref Range Status   SARS Coronavirus 2 NEGATIVE NEGATIVE Final    Comment: (NOTE) SARS-CoV-2 target nucleic acids are NOT DETECTED.  The SARS-CoV-2 RNA is generally detectable in upper and lower respiratory specimens during the acute phase of infection. Negative results do not preclude SARS-CoV-2 infection, do not rule out co-infections with other pathogens, and should not be used as the sole basis for treatment or other patient management decisions. Negative results must be combined with  clinical observations, patient history, and epidemiological information. The expected result is Negative.  Fact Sheet for Patients: SugarRoll.be  Fact Sheet for Healthcare Providers: https://www.woods-mathews.com/  This test is not yet approved or cleared by the [place] FDA and  has been authorized for detection and/or diagnosis of SARS-CoV-2 by FDA under an Emergency Use Authorization (EUA). This EUA will remain  in effect (meaning this test can be used) for the duration of the COVID-19 declaration under Se ction 564(b)(1) of the Act, 21 U.S.C. section 360bbb-3(b)(1), unless the authorization is terminated or revoked sooner.  Performed at [facility], [place] [address] [place], [place] [address]          Radiology Studies: DG Chest Port 1 View  Result Date: 10/07/2020 CLINICAL DATA:  Syncopal episode. EXAM: PORTABLE CHEST 1 VIEW COMPARISON:  None. FINDINGS: The heart size and mediastinal contours are within normal limits. Both lungs are clear. The visualized skeletal structures are unremarkable. IMPRESSION: No active disease. Electronically Signed   By: Dorise Bullion III M.D   On: 10/07/2020 09:04        Scheduled Meds:  diltiazem  180 mg Oral Daily   enoxaparin (LOVENOX) injection  40 mg Subcutaneous Q24H   escitalopram  10 mg Oral Daily   lisinopril  5 mg Oral Daily   memantine  5 mg Oral BID   nitrofurantoin (macrocrystal-monohydrate)  100 mg Oral Q12H   sodium chloride flush  3 mL Intravenous Q12H   traZODone  50 mg Oral QHS   Continuous Infusions:  sodium chloride     sodium chloride 100 mL/hr at 10/08/20 0722     LOS: 2 days    Time spent: 30 min    Amy Maxim, MD [facility]   If 7PM-7AM, please contact night-coverage [email or web address] Password [facility] 10/08/2020, 10:38 AM

## 2020-10-08 NOTE — Plan of Care (Signed)

## 2020-10-08 NOTE — Progress Notes (Signed)
Discharge instructions given to son with stated understanding

## 2020-10-08 NOTE — Progress Notes (Signed)
OT Cancellation Note  Patient Details Name: Amy Stanley MRN: [id number] DOB: 08-26-1935   Cancelled Treatment:    Reason Eval/Treat Not Completed: Fatigue/lethargy limiting ability to participate. Upon arrival to room this AM, pt sound asleep. Attempted to wake by light touch and name called, however pt continuing to sleep. Per RN, pt will change rooms later this AM. Will re-attempt to eval at later time.   Fredirick Maudlin, OTR/L [place]

## 2020-10-10 DIAGNOSIS — R413 Other amnesia: Secondary | ICD-10-CM | POA: Diagnosis not present

## 2020-10-10 DIAGNOSIS — I493 Ventricular premature depolarization: Secondary | ICD-10-CM | POA: Diagnosis not present

## 2020-10-10 DIAGNOSIS — Z0001 Encounter for general adult medical examination with abnormal findings: Secondary | ICD-10-CM | POA: Diagnosis not present

## 2020-10-10 DIAGNOSIS — G4700 Insomnia, unspecified: Secondary | ICD-10-CM | POA: Diagnosis not present

## 2020-10-10 DIAGNOSIS — F0280 Dementia in other diseases classified elsewhere without behavioral disturbance: Secondary | ICD-10-CM | POA: Diagnosis not present

## 2020-10-10 DIAGNOSIS — Z90710 Acquired absence of both cervix and uterus: Secondary | ICD-10-CM | POA: Diagnosis not present

## 2020-10-10 DIAGNOSIS — K529 Noninfective gastroenteritis and colitis, unspecified: Secondary | ICD-10-CM | POA: Diagnosis not present

## 2020-10-10 DIAGNOSIS — N390 Urinary tract infection, site not specified: Secondary | ICD-10-CM | POA: Diagnosis not present

## 2020-10-10 DIAGNOSIS — G309 Alzheimer's disease, unspecified: Secondary | ICD-10-CM | POA: Diagnosis not present

## 2020-10-10 DIAGNOSIS — I10 Essential (primary) hypertension: Secondary | ICD-10-CM | POA: Diagnosis not present

## 2020-10-10 DIAGNOSIS — Z0000 Encounter for general adult medical examination without abnormal findings: Secondary | ICD-10-CM | POA: Diagnosis not present

## 2020-10-10 DIAGNOSIS — F0390 Unspecified dementia without behavioral disturbance: Secondary | ICD-10-CM | POA: Diagnosis not present

## 2020-10-10 DIAGNOSIS — E7800 Pure hypercholesterolemia, unspecified: Secondary | ICD-10-CM | POA: Diagnosis not present

## 2020-10-10 DIAGNOSIS — Z9181 History of falling: Secondary | ICD-10-CM | POA: Diagnosis not present

## 2020-10-10 DIAGNOSIS — I480 Paroxysmal atrial fibrillation: Secondary | ICD-10-CM | POA: Diagnosis not present

## 2020-10-10 DIAGNOSIS — E538 Deficiency of other specified B group vitamins: Secondary | ICD-10-CM | POA: Diagnosis not present

## 2020-10-10 DIAGNOSIS — F329 Major depressive disorder, single episode, unspecified: Secondary | ICD-10-CM | POA: Diagnosis not present

## 2020-10-10 DIAGNOSIS — F32A Depression, unspecified: Secondary | ICD-10-CM | POA: Diagnosis not present

## 2020-10-17 DIAGNOSIS — R399 Unspecified symptoms and signs involving the genitourinary system: Secondary | ICD-10-CM | POA: Diagnosis not present

## 2020-10-29 ENCOUNTER — Telehealth: Payer: Self-pay

## 2020-10-29 NOTE — Telephone Encounter (Signed)
Spoke with patient's son Ron and scheduled an in-person Palliative Consult for 11/15/20 @ 1PM  COVID screening was negative. Pets in home but will put away. Patient lives with son and daughter in law.   Consent obtained; updated Outlook/Netsmart/Team List and Epic.  Family is aware they may be receiving a call from NP the day before or day of to confirm appointment.

## 2020-11-01 DIAGNOSIS — E7800 Pure hypercholesterolemia, unspecified: Secondary | ICD-10-CM | POA: Diagnosis not present

## 2020-11-01 DIAGNOSIS — R55 Syncope and collapse: Secondary | ICD-10-CM | POA: Diagnosis not present

## 2020-11-01 DIAGNOSIS — I351 Nonrheumatic aortic (valve) insufficiency: Secondary | ICD-10-CM | POA: Insufficient documentation

## 2020-11-01 DIAGNOSIS — I493 Ventricular premature depolarization: Secondary | ICD-10-CM | POA: Diagnosis not present

## 2020-11-01 DIAGNOSIS — I4891 Unspecified atrial fibrillation: Secondary | ICD-10-CM | POA: Diagnosis not present

## 2020-11-01 DIAGNOSIS — R001 Bradycardia, unspecified: Secondary | ICD-10-CM | POA: Diagnosis not present

## 2020-11-01 DIAGNOSIS — I517 Cardiomegaly: Secondary | ICD-10-CM | POA: Insufficient documentation

## 2020-11-01 DIAGNOSIS — R002 Palpitations: Secondary | ICD-10-CM | POA: Diagnosis not present

## 2020-11-01 DIAGNOSIS — R000 Tachycardia, unspecified: Secondary | ICD-10-CM | POA: Diagnosis not present

## 2020-11-01 DIAGNOSIS — I10 Essential (primary) hypertension: Secondary | ICD-10-CM | POA: Diagnosis not present

## 2020-11-07 DIAGNOSIS — R350 Frequency of micturition: Secondary | ICD-10-CM | POA: Diagnosis not present

## 2020-11-15 ENCOUNTER — Other Ambulatory Visit: Payer: PPO | Admitting: Adult Health Nurse Practitioner

## 2020-11-15 ENCOUNTER — Other Ambulatory Visit: Payer: Self-pay

## 2020-11-15 VITALS — BP 142/90 | HR 75

## 2020-11-15 DIAGNOSIS — Z515 Encounter for palliative care: Secondary | ICD-10-CM

## 2020-11-15 DIAGNOSIS — G309 Alzheimer's disease, unspecified: Secondary | ICD-10-CM | POA: Diagnosis not present

## 2020-11-15 DIAGNOSIS — F0280 Dementia in other diseases classified elsewhere without behavioral disturbance: Secondary | ICD-10-CM

## 2020-11-15 NOTE — Progress Notes (Signed)
[facility] Consult Note Telephone: [phone]  Fax: [phone]    Date of encounter: 11/15/20 PATIENT NAME: Amy Stanley [place] [address]   [phone] (home)  DOB: [birth year] MRN: [id number] PRIMARY CARE PROVIDER:    Baxter Hire, MD,  [place] [address] [phone]  REFERRING PROVIDER:   Baxter Hire, MD [place],  [place] [id number] [phone]  RESPONSIBLE PARTY:    Contact Information    Name Relation Home Work Mobile   Chelsey, [place] [phone]         I met face to face with patient and family in home. Palliative Care was asked to follow this patient by consultation request of  Baxter Hire, MD to address advance care planning and complex medical decision making. This is the initial visit. Son present during visit today.                                    ASSESSMENT AND PLAN / RECOMMENDATIONS:   Advance Care Planning/Goals of Care: Goals include to maximize quality of life and symptom management. Son states that she is a DNR.  Does not have a copy of DNR in home.  Filled out, scanned, and uploaded DNR to [facility] and left original with son.  Has living will and encouraged to make sure providers have copies.   CODE STATUS: DNR  Symptom Management/Plan:  Dementia: Patient having worsening cognitive decline.  She is requiring more assistance with ADLs.  Discussed with son disease progression and what to expect.  He is starting to get in-home help and states that he has been looking around at facilities with memory care units and is on waiting list at [facility].  Have suggested that he can try melatonin to see if this will help her sleep better throughout the night.  Palliative will continue to monitor for symptom management/decline and make recommendations as needed.  Follow up Palliative Care Visit: Palliative care will continue to follow for  complex medical decision making, advance care planning, and clarification of goals. Return 4 weeks or prn. Son encouraged to call with any questions or concerns  I spent 75 minutes providing this consultation. More than 50% of the time in this consultation was spent in counseling and care coordination.  PPS: 40%  HOSPICE ELIGIBILITY/DIAGNOSIS: TBD  Chief Complaint: initial palliative visit for complex medical decision making  HISTORY OF PRESENT ILLNESS:  PERRIS TRIPATHI is an 85 y.o. year old female  with Alzheimer's dementia, HTN, depression, h/o skin cancer.  Patient lives with son in his home.  She has her own living area downstairs.  About 3 weeks ago started having sitters.  Son states that they are starting out with just a couple days a week and will increase from there.  Patient did have hospitalization 3/5 through 10/08/2020 for syncope and collapse.  Patient did have a severe UTI and new onset A. fib was found with RVR.  Joint decision with medical providers and family not to pursue anticoagulation due to her dementia.  Son states that she was seen by cardiology and wore the heart monitor.  Has appointment on 11/22/2020 to evaluate results.  Son does have cameras in her living space to monitor her and has safety features such as grab bars and shower bench in the shower.  Patient is able  to ambulate without assistive devices.  No reports of falls.  She does require assistance with ADLs.  She is continent of bowel and bladder.  She is able to feed herself but son does state that she is forgetting how to cut her food and is requiring some assistance with this.  She does eat well but has had a gradual weight loss.  Son states that the beginning of 2021 she weighed 147 pounds and currently weighs 124 pounds.  On 10/10/2020 she weighed 125 pounds with BMI of 22.14.  Son's main concern is that she has had a rapid cognitive decline states that over the past couple of months she was able to dress appropriately  and now she will put clothes on over her pajamas and will only put on one shoe or sock.  She is not able to converse like she used to and in mid sentence quickly forgets what she wanted to say.  When asked questions she does seem confused and is unsure how to answer them.  This could be because of anxiety related to provider being a stranger to her.  Son does state that she does get more confused in the evenings.  She is on Lexapro 10 mg twice daily, memantine 5 mg twice daily, and trazodone 50 mg nightly.  He does state that sometimes she will get up in the middle of the night.  Son assists with HPI/ROS as patient unable to contribute due to dementia.  Patient does not complain of pain in son states that he has not noticed her having any signs of pain.  She does take a stool softener daily with good results.  She has regular BMs.  She does have bladder leaks and wears depends she is currently on oxybutynin 5 mg twice daily.  No reported wounds  History obtained from review of EMR, and interview with family and Amy Stanley.  I reviewed available labs, medications, imaging, studies and related documents from the EMR.  Records reviewed and summarized above.   Physical Exam: Constitutional: NAD General: frail appearing,WNWD EYES: anicteric sclera, lids intact, no discharge  ENMT: intact hearing, oral mucous membranes moist CV: S1S2, irregularly regular heart rate and rhythm, no LE edema Pulmonary: LCTA, no increased work of breathing, no cough Abdomen:  normo-active BS + 4 quadrants, soft and non tender, no ascites MSK: moves all extremities; ambulatory Skin: warm and dry, no rashes or wounds on visible skin Neuro:  Patient alert and oriented to person and place Hem/lymph/immuno: no widespread bruising   CURRENT PROBLEM LIST:  Patient Active Problem List   Diagnosis Date Noted  . Atrial fibrillation with rapid ventricular response ([place]) 10/06/2020  . Acute lower UTI 10/06/2020  . Syncope and  collapse 10/06/2020  . Dementia ([place]) 10/06/2020  . Hearing loss 09/25/2020  . Bradycardia 01/29/2017  . Hypertension 03/09/2014   PAST MEDICAL HISTORY:  Active Ambulatory Problems    Diagnosis Date Noted  . Atrial fibrillation with rapid ventricular response ([place]) 10/06/2020  . Acute lower UTI 10/06/2020  . Syncope and collapse 10/06/2020  . Dementia ([place]) 10/06/2020  . Bradycardia 01/29/2017  . Hearing loss 09/25/2020  . Hypertension 03/09/2014   Resolved Ambulatory Problems    Diagnosis Date Noted  . No Resolved Ambulatory Problems   No Additional Past Medical History   SOCIAL HX:  Social History   Tobacco Use  . Smoking status: Never Smoker  . Smokeless tobacco: Never Used  Substance Use Topics  . Alcohol use: Never  FAMILY HX: FAMILY HISTORY Family History  Problem Relation Age of Onset  . Alzheimer's disease Mother  . Skin cancer Father      ALLERGIES: No Known Allergies   PERTINENT MEDICATIONS:  Outpatient Encounter Medications as of 11/15/2020  Medication Sig  . cholestyramine (QUESTRAN) 4 g packet Take 4 g by mouth daily. (Patient not taking: No sig reported)  . diltiazem (CARDIZEM CD) 180 MG 24 hr capsule Take 1 capsule (180 mg total) by mouth daily.  Marland Kitchen donepezil (ARICEPT) 10 MG tablet Take 10 mg by mouth at bedtime. (Patient not taking: No sig reported)  . escitalopram (LEXAPRO) 10 MG tablet Take 10 mg by mouth daily.  . memantine (NAMENDA) 5 MG tablet Take 5 mg by mouth 2 (two) times daily.  . nitrofurantoin, macrocrystal-monohydrate, (MACROBID) 100 MG capsule Take 1 capsule (100 mg total) by mouth 2 (two) times daily.  . traZODone (DESYREL) 50 MG tablet Take 50 mg by mouth at bedtime.   No facility-administered encounter medications on file as of 11/15/2020.    Thank you for the opportunity to participate in the care of Amy Stanley.  The palliative care team will continue to follow. Please call our office at [phone] if we can be of additional  assistance.   [place] Jenetta Downer, NP , DNP  This chart was dictated using voice recognition software. Despite best efforts to proofread, errors can occur which can change the documentation meaning.   COVID-19 PATIENT SCREENING TOOL Asked and negative response unless otherwise noted:   Have you had symptoms of covid, tested positive or been in contact with someone with symptoms/positive test in the past 5-10 days? negative

## 2020-11-26 DIAGNOSIS — I4891 Unspecified atrial fibrillation: Secondary | ICD-10-CM | POA: Diagnosis not present

## 2020-12-14 ENCOUNTER — Telehealth: Payer: Self-pay | Admitting: Adult Health Nurse Practitioner

## 2020-12-14 NOTE — Telephone Encounter (Signed)
Called son to remind of Monday's appointment with his mom. Amy Stanley K. [name] NP

## 2020-12-17 ENCOUNTER — Other Ambulatory Visit: Payer: Self-pay

## 2020-12-17 ENCOUNTER — Other Ambulatory Visit: Payer: PPO | Admitting: Adult Health Nurse Practitioner

## 2020-12-17 ENCOUNTER — Encounter: Payer: Self-pay | Admitting: Adult Health Nurse Practitioner

## 2020-12-17 VITALS — BP 132/68 | HR 76

## 2020-12-17 DIAGNOSIS — F0280 Dementia in other diseases classified elsewhere without behavioral disturbance: Secondary | ICD-10-CM

## 2020-12-17 DIAGNOSIS — Z515 Encounter for palliative care: Secondary | ICD-10-CM | POA: Diagnosis not present

## 2020-12-17 DIAGNOSIS — G309 Alzheimer's disease, unspecified: Secondary | ICD-10-CM | POA: Diagnosis not present

## 2020-12-17 NOTE — Progress Notes (Signed)
[facility] Consult Note Telephone: [phone]  Fax: [phone]    Date of encounter: 12/17/20 PATIENT NAME: Amy Stanley [address] [place] [address]   [phone] (home)  DOB: 18-Oct-1935 MRN: [id number] PRIMARY CARE PROVIDER:    Baxter Hire, MD,  [place] [address] [phone]  REFERRING PROVIDER:   Baxter Hire, MD [place],  [place] [address] [phone]  RESPONSIBLE PARTY:    Contact Information    Name Relation Home Work Mobile   Amy Stanley, Amy Stanley [phone]         I met face to face with patient and family in home. Palliative Care was asked to follow this patient by consultation request of  Baxter Hire, MD to address advance care planning and complex medical decision making. This is a follow up visit.  Son present during visit today.                                   ASSESSMENT AND PLAN / RECOMMENDATIONS:   Advance Care Planning/Goals of Care: Goals include to maximize quality of life and symptom management.   CODE STATUS: DNR  Symptom Management/Plan:  Alzheimer's dementia:  Patient is stable at this time.  She does require 24/7 care, which she gets living with her son and aides that come in to help.  She is still on waiting list for [facility] memory care unit.   Follow up Palliative Care Visit: Palliative care will continue to follow for complex medical decision making, advance care planning, and clarification of goals. Will do check in calls every 2-3 months and encouraged to call sooner with any questions or concerns and if visit needed.  I spent 40 minutes providing this consultation. More than 50% of the time in this consultation was spent in counseling and care coordination.   PPS: 40%  HOSPICE ELIGIBILITY/DIAGNOSIS: TBD  Chief Complaint: follow up palliative visit  HISTORY OF PRESENT ILLNESS:  Amy Stanley is a 85 y.o. year old  female  with Alzheimer's dementia, HTN, depression, h/o skin cancer. Patient unable to contribute to HPI/ROS secondary to dementia.  Son  Helps contribute to HPI.  Son states that she is walking slower.  Denies falls.  Her appetite is good.  She does get up several times during the night to go to the bathroom, though son doubts she is urinating each time.  This probably related to the dementia.  She tries to put on her own clothes but will put on clothes on top of what she is wearing or putting her clothes on inside out.  She is unable to hold a conversation.  When asked questions she is unable to finish what her answer is.  She does follow commands well. Son does state that she can be resistant to the aides that come in to help and will ask when they are leaving. Rest of 10 point ROS asked and negative except what is stated in HPI  History obtained from review of EMR and interview with family and Ms. Fester.     Physical Exam: Constitutional: NAD General: frail appearing,WNWD EYES: anicteric sclera, lids intact, no discharge  ENMT: intact hearing, oral mucous membranes moist CV: S1S2, irregularly regular heart rate and rhythm, no LE edema Pulmonary: LCTA, no increased work of breathing, no cough Abdomen:  normo-active BS + 4 quadrants, soft  and non tender MSK: moves all extremities; ambulatory Skin: warm and dry, no rashes or wounds on visible skin Neuro:  Patient alert and oriented to person and place Hem/lymph/immuno: no widespread bruising  Thank you for the opportunity to participate in the care of Ms. Yonan.  The palliative care team will continue to follow. Please call our office at [phone] if we can be of additional assistance.   Lenda Baratta Jenetta Downer, NP , DNP  This chart was dictated using voice recognition software. Despite best efforts to proofread, errors can occur which can change the documentation meaning.   COVID-19 PATIENT SCREENING TOOL Asked and negative response unless  otherwise noted:   Have you had symptoms of covid, tested positive or been in contact with someone with symptoms/positive test in the past 5-10 days? negative

## 2020-12-28 DIAGNOSIS — R399 Unspecified symptoms and signs involving the genitourinary system: Secondary | ICD-10-CM | POA: Diagnosis not present

## 2021-01-03 DIAGNOSIS — D485 Neoplasm of uncertain behavior of skin: Secondary | ICD-10-CM | POA: Diagnosis not present

## 2021-01-03 DIAGNOSIS — B078 Other viral warts: Secondary | ICD-10-CM | POA: Diagnosis not present

## 2021-01-11 DIAGNOSIS — Z111 Encounter for screening for respiratory tuberculosis: Secondary | ICD-10-CM | POA: Diagnosis not present

## 2021-01-15 ENCOUNTER — Other Ambulatory Visit: Payer: Self-pay

## 2021-01-15 ENCOUNTER — Ambulatory Visit: Payer: PPO | Admitting: Podiatry

## 2021-01-15 ENCOUNTER — Encounter: Payer: Self-pay | Admitting: Podiatry

## 2021-01-15 DIAGNOSIS — M79676 Pain in unspecified toe(s): Secondary | ICD-10-CM | POA: Diagnosis not present

## 2021-01-15 DIAGNOSIS — L989 Disorder of the skin and subcutaneous tissue, unspecified: Secondary | ICD-10-CM | POA: Diagnosis not present

## 2021-01-15 DIAGNOSIS — B351 Tinea unguium: Secondary | ICD-10-CM

## 2021-01-15 NOTE — Patient Instructions (Signed)
Salicylic Acid topical gel, cream, lotion, solution What is this medication? SALICYCLIC ACID (SAL i SIL ik AS id) breaks down layers of thick skin. It treats common and plantar warts, psoriasis, calluses, and corns. It also treatsor prevents acne. This medicine may be used for other purposes; ask your health care provider orpharmacist if you have questions. COMMON BRAND NAME(S): [place]bon Jabs, Clear Away Liquid, Clearasil Total Control, Clearasil Ultra Scrub, Compound W, Corn/Callus Remover, DermacinRx Atrix, Dermarest Psoriasis Moisturizer, Dermarest Psoriasis Overnight Treatment, Dermarest Psoriasis Scalp Treatment, Dermarest Psoriasis Skin Treatment, Dr.Scholl's Dual Action FREEZE AWAY, DuoFilm Wart Remover, Benjamin, Gordofilm, Hydrisalic, [place], [employer] Callus & Nord[employer], Neutrogena Acne [place], [place], RE [place], [place], [employer], [place], [place], [place], [employer]ands, [place], [employer], [place], [employer], [place], [employer] 2in 1 Anti-Dandruff, [place], [place], Wart-Off, Niel Hummer What should I tell my care team before I take this medication? They need to know if you have any of these conditions: child with chickenpox, the flu, or other viral infection kidney disease liver disease an unusual or allergic reaction to salicylic acid, other medicines, foods, dyes, or preservatives pregnant or trying to get pregnant breast-feeding How should I use this medication? This medicine is for external use only. Follow the directions on the label. Do not apply to raw or irritated skin. Avoid getting medicine in your eyes, lips, nose, mouth, or other sensitive areas. Use this medicine at regular intervals.Do not use more often than directed. Talk to your pediatrician regarding the use of this medicine in children. Special care may be needed. This medicine is not approved for use in childrenunder 83 years old. Overdosage: If you think you have taken too much of this medicine contact apoison  control center or emergency room at once. NOTE: This medicine is only for you. Do not share this medicine with others. What if I miss a dose? If you miss a dose, use it as soon as you can. If it is almost time for yournext dose, use only that dose. Do not use double or extra doses. What may interact with this medication? medicines that change urine pH like ammonium chloride, sodium bicarbonate, and others medicines that treat or prevent blood clots like warfarin methotrexate pyrazinamide some medicines for diabetes some medicines for gout steroid medicines like prednisone or cortisone This list may not describe all possible interactions. Give your health care provider a list of all the medicines, herbs, non-prescription drugs, or dietary supplements you use. Also tell them if you smoke, drink alcohol, or use illegaldrugs. Some items may interact with your medicine. What should I watch for while using this medication? Tell your doctor is your symptoms do not get better or if they get worse. This medicine can make you more sensitive to the sun. Keep out of the sun. If you cannot avoid being in the sun, wear protective clothing and use sunscreen.Do not use sun lamps or tanning beds/booths. Use of this medicine in children under 12 years or in patients with kidney or liver disease may increase the risk of serious side effects. These patients should not use this medicine over large areas of skin. If you notice symptoms such as nausea, vomiting, dizziness, loss of hearing, ringing in the ears, unusual weakness or tiredness, fast or labored breathing, diarrhea, or confusion, stop using this medicine and contact your doctor or health careprofessional. What side effects may I notice from receiving this medication? Side effects that you should report to your doctor or health care professionalas soon as possible: allergic reactions  like skin rash, itching or hives, swelling of the face, lips, or tongue Side  effects that usually do not require medical attention (report to yourdoctor or health care professional if they continue or are bothersome): skin irritation This list may not describe all possible side effects. Call your doctor for medical advice about side effects. You may report side effects to FDA at1-800-FDA-1088. Where should I keep my medication? Keep out of the reach of children. Store at room temperature between 15 and 30 degrees C (59 and 86 degrees F). Donot freeze. Throw away any unused medicine after the expiration date. NOTE: This sheet is a summary. It may not cover all possible information. If you have questions about this medicine, talk to your doctor, pharmacist, orhealth care provider.  2022 Elsevier/Gold Standard (2019-04-08 14:15:35)

## 2021-01-15 NOTE — Progress Notes (Signed)
   Subjective: 85 y.o. female presenting to the office today as a new patient with her daughter-in-law for evaluation of a symptomatic callus to the plantar aspect of the fifth MTPJ of the left foot.  Patient states that is very painful to walk.  She has not done anything for treatment Patient also has a thickened dystrophic discolored nail noted to the left hallux nail plate that is been present for several years.  It does have some sensitivity in certain shoes.  She presents for further treatment and evaluation   Past Medical History:  Diagnosis Date   Hypertension      Objective:  Physical Exam General: Alert and oriented x3 in no acute distress  Dermatology: Hyperkeratotic lesion(s) present on the subphase MTPJ left. Pain on palpation with a central nucleated core noted. Skin is warm, dry and supple bilateral lower extremities. Negative for open lesions or macerations.  Hyperkeratotic dystrophic discolored nail noted to the left hallux nail plate  Vascular: Palpable pedal pulses bilaterally. No edema or erythema noted. Capillary refill within normal limits.  Neurological: Epicritic and protective threshold grossly intact bilaterally.   Musculoskeletal Exam: Pain on palpation at the keratotic lesion(s) noted. Range of motion within normal limits bilateral. Muscle strength 5/5 in all groups bilateral.  Assessment: 1.  Preulcerative callus lesion subfifth MTPJ left 2.  Dystrophic nail left hallux   Plan of Care:  1. Patient evaluated 2. Excisional debridement of keratoic lesion(s) using a chisel blade was performed without incident.  3. Dressed area with light dressing. 4.  Mechanical debridement of the left hallux nail plate was performed using a nail nipper without incident or bleeding.  Patient felt immediate relief  5.  Patient is to return to the clinic PRN.   I treat the patient's family.  The daughter-in-law was present today and has been in the office with her mother and  her daughter, Mckynzi Cammon  Edrick Kins, DPM [facility]  Dr. Edrick Kins, DPM    [address], [place] [address]                Office [phone]  Fax [phone]

## 2021-01-16 DIAGNOSIS — Z20822 Contact with and (suspected) exposure to covid-19: Secondary | ICD-10-CM | POA: Diagnosis not present

## 2021-01-16 DIAGNOSIS — Z03818 Encounter for observation for suspected exposure to other biological agents ruled out: Secondary | ICD-10-CM | POA: Diagnosis not present

## 2021-01-17 ENCOUNTER — Emergency Department: Payer: PPO

## 2021-01-17 ENCOUNTER — Other Ambulatory Visit: Payer: Self-pay

## 2021-01-17 ENCOUNTER — Inpatient Hospital Stay
Admission: EM | Admit: 2021-01-17 | Discharge: 2021-01-24 | DRG: 871 | Disposition: A | Discharge status: Skilled Nursing Facility | Payer: PPO | Attending: Internal Medicine | Admitting: Internal Medicine

## 2021-01-17 DIAGNOSIS — J189 Pneumonia, unspecified organism: Secondary | ICD-10-CM | POA: Diagnosis not present

## 2021-01-17 DIAGNOSIS — F0390 Unspecified dementia without behavioral disturbance: Secondary | ICD-10-CM | POA: Diagnosis present

## 2021-01-17 DIAGNOSIS — N179 Acute kidney failure, unspecified: Secondary | ICD-10-CM | POA: Diagnosis present

## 2021-01-17 DIAGNOSIS — R7989 Other specified abnormal findings of blood chemistry: Secondary | ICD-10-CM | POA: Diagnosis present

## 2021-01-17 DIAGNOSIS — I4891 Unspecified atrial fibrillation: Secondary | ICD-10-CM

## 2021-01-17 DIAGNOSIS — I10 Essential (primary) hypertension: Secondary | ICD-10-CM | POA: Diagnosis not present

## 2021-01-17 DIAGNOSIS — I480 Paroxysmal atrial fibrillation: Secondary | ICD-10-CM | POA: Diagnosis not present

## 2021-01-17 DIAGNOSIS — R1310 Dysphagia, unspecified: Secondary | ICD-10-CM | POA: Diagnosis not present

## 2021-01-17 DIAGNOSIS — R778 Other specified abnormalities of plasma proteins: Secondary | ICD-10-CM | POA: Diagnosis not present

## 2021-01-17 DIAGNOSIS — G309 Alzheimer's disease, unspecified: Secondary | ICD-10-CM | POA: Diagnosis not present

## 2021-01-17 DIAGNOSIS — R0902 Hypoxemia: Secondary | ICD-10-CM | POA: Diagnosis not present

## 2021-01-17 DIAGNOSIS — F0280 Dementia in other diseases classified elsewhere without behavioral disturbance: Secondary | ICD-10-CM | POA: Diagnosis not present

## 2021-01-17 DIAGNOSIS — R41841 Cognitive communication deficit: Secondary | ICD-10-CM | POA: Diagnosis not present

## 2021-01-17 DIAGNOSIS — R0602 Shortness of breath: Secondary | ICD-10-CM | POA: Diagnosis not present

## 2021-01-17 DIAGNOSIS — J9601 Acute respiratory failure with hypoxia: Secondary | ICD-10-CM | POA: Diagnosis present

## 2021-01-17 DIAGNOSIS — R000 Tachycardia, unspecified: Secondary | ICD-10-CM | POA: Diagnosis not present

## 2021-01-17 DIAGNOSIS — R197 Diarrhea, unspecified: Secondary | ICD-10-CM | POA: Diagnosis present

## 2021-01-17 DIAGNOSIS — H9190 Unspecified hearing loss, unspecified ear: Secondary | ICD-10-CM | POA: Diagnosis present

## 2021-01-17 DIAGNOSIS — N390 Urinary tract infection, site not specified: Secondary | ICD-10-CM | POA: Diagnosis present

## 2021-01-17 DIAGNOSIS — E872 Acidosis: Secondary | ICD-10-CM | POA: Diagnosis present

## 2021-01-17 DIAGNOSIS — Z79899 Other long term (current) drug therapy: Secondary | ICD-10-CM | POA: Diagnosis not present

## 2021-01-17 DIAGNOSIS — E785 Hyperlipidemia, unspecified: Secondary | ICD-10-CM | POA: Diagnosis present

## 2021-01-17 DIAGNOSIS — Z7189 Other specified counseling: Secondary | ICD-10-CM | POA: Diagnosis not present

## 2021-01-17 DIAGNOSIS — R55 Syncope and collapse: Secondary | ICD-10-CM | POA: Diagnosis present

## 2021-01-17 DIAGNOSIS — R4182 Altered mental status, unspecified: Secondary | ICD-10-CM | POA: Diagnosis not present

## 2021-01-17 DIAGNOSIS — F32A Depression, unspecified: Secondary | ICD-10-CM | POA: Diagnosis present

## 2021-01-17 DIAGNOSIS — R6520 Severe sepsis without septic shock: Secondary | ICD-10-CM | POA: Diagnosis not present

## 2021-01-17 DIAGNOSIS — Z20822 Contact with and (suspected) exposure to covid-19: Secondary | ICD-10-CM | POA: Diagnosis not present

## 2021-01-17 DIAGNOSIS — A419 Sepsis, unspecified organism: Secondary | ICD-10-CM | POA: Diagnosis not present

## 2021-01-17 DIAGNOSIS — F05 Delirium due to known physiological condition: Secondary | ICD-10-CM | POA: Diagnosis present

## 2021-01-17 DIAGNOSIS — M6281 Muscle weakness (generalized): Secondary | ICD-10-CM | POA: Diagnosis not present

## 2021-01-17 DIAGNOSIS — Z515 Encounter for palliative care: Secondary | ICD-10-CM | POA: Diagnosis not present

## 2021-01-17 DIAGNOSIS — I959 Hypotension, unspecified: Secondary | ICD-10-CM | POA: Diagnosis not present

## 2021-01-17 DIAGNOSIS — Z66 Do not resuscitate: Secondary | ICD-10-CM | POA: Diagnosis present

## 2021-01-17 DIAGNOSIS — I248 Other forms of acute ischemic heart disease: Secondary | ICD-10-CM | POA: Diagnosis present

## 2021-01-17 DIAGNOSIS — R2681 Unsteadiness on feet: Secondary | ICD-10-CM | POA: Diagnosis not present

## 2021-01-17 DIAGNOSIS — R001 Bradycardia, unspecified: Secondary | ICD-10-CM | POA: Diagnosis not present

## 2021-01-17 DIAGNOSIS — R0689 Other abnormalities of breathing: Secondary | ICD-10-CM | POA: Diagnosis not present

## 2021-01-17 LAB — BASIC METABOLIC PANEL WITH GFR
Anion gap: 8 (ref 5–15)
BUN: 31 mg/dL — ABNORMAL HIGH (ref 8–23)
CO2: 18 mmol/L — ABNORMAL LOW (ref 22–32)
Calcium: 9.4 mg/dL (ref 8.9–10.3)
Chloride: 111 mmol/L (ref 98–111)
Creatinine, Ser: 1.21 mg/dL — ABNORMAL HIGH (ref 0.44–1.00)
GFR, Estimated: 44 mL/min — ABNORMAL LOW
Glucose, Bld: 158 mg/dL — ABNORMAL HIGH (ref 70–99)
Potassium: 4.3 mmol/L (ref 3.5–5.1)
Sodium: 137 mmol/L (ref 135–145)

## 2021-01-17 LAB — TROPONIN I (HIGH SENSITIVITY)
Troponin I (High Sensitivity): 139 ng/L
Troponin I (High Sensitivity): 148 ng/L (ref <18)
Troponin I (High Sensitivity): 145 ng/L (ref <18)
Troponin I (High Sensitivity): 129 ng/L (ref <18)

## 2021-01-17 LAB — CBC WITH DIFFERENTIAL/PLATELET
Abs Immature Granulocytes: 0.11 10*3/uL — ABNORMAL HIGH (ref 0.00–0.07)
Basophils Absolute: 0 10*3/uL (ref 0.0–0.1)
Basophils Relative: 0 %
Eosinophils Absolute: 0 10*3/uL (ref 0.0–0.5)
Eosinophils Relative: 0 %
HCT: 43.5 % (ref 36.0–46.0)
Hemoglobin: 14.6 g/dL (ref 12.0–15.0)
Immature Granulocytes: 1 %
Lymphocytes Relative: 2 %
Lymphs Abs: 0.3 10*3/uL — ABNORMAL LOW (ref 0.7–4.0)
MCH: 32.4 pg (ref 26.0–34.0)
MCHC: 33.6 g/dL (ref 30.0–36.0)
MCV: 96.5 fL (ref 80.0–100.0)
Monocytes Absolute: 0.6 10*3/uL (ref 0.1–1.0)
Monocytes Relative: 3 %
Neutro Abs: 16.7 10*3/uL — ABNORMAL HIGH (ref 1.7–7.7)
Neutrophils Relative %: 94 %
Platelets: 213 10*3/uL (ref 150–400)
RBC: 4.51 MIL/uL (ref 3.87–5.11)
RDW: 13.3 % (ref 11.5–15.5)
WBC: 17.7 10*3/uL — ABNORMAL HIGH (ref 4.0–10.5)
nRBC: 0 % (ref 0.0–0.2)

## 2021-01-17 LAB — URINALYSIS, COMPLETE (UACMP) WITH MICROSCOPIC
Bilirubin Urine: NEGATIVE
Glucose, UA: NEGATIVE mg/dL
Hgb urine dipstick: NEGATIVE
Ketones, ur: 5 mg/dL — AB
Nitrite: NEGATIVE
Protein, ur: 100 mg/dL — AB
Specific Gravity, Urine: 1.027 (ref 1.005–1.030)
pH: 5 (ref 5.0–8.0)

## 2021-01-17 LAB — LACTIC ACID, PLASMA
Lactic Acid, Venous: 3.3 mmol/L (ref 0.5–1.9)
Lactic Acid, Venous: 2.7 mmol/L (ref 0.5–1.9)

## 2021-01-17 LAB — RESP PANEL BY RT-PCR (FLU A&B, COVID) ARPGX2
Influenza A by PCR: NEGATIVE
Influenza B by PCR: NEGATIVE
SARS Coronavirus 2 by RT PCR: NEGATIVE

## 2021-01-17 LAB — MAGNESIUM: Magnesium: 2.1 mg/dL (ref 1.7–2.4)

## 2021-01-17 LAB — BRAIN NATRIURETIC PEPTIDE: B Natriuretic Peptide: 307.4 pg/mL — ABNORMAL HIGH (ref 0.0–100.0)

## 2021-01-17 LAB — PROCALCITONIN: Procalcitonin: 1.68 ng/mL

## 2021-01-17 MED ORDER — LACTATED RINGERS IV SOLN: INTRAVENOUS | Status: DC

## 2021-01-17 MED ORDER — ALBUTEROL SULFATE (2.5 MG/3ML) 0.083% IN NEBU: 2.5000 mg | INHALATION_SOLUTION | RESPIRATORY_TRACT | Status: DC | PRN

## 2021-01-17 MED ORDER — ESCITALOPRAM OXALATE 10 MG PO TABS
10.0000 mg | ORAL_TABLET | Freq: Every day | ORAL | Status: DC
  Administered 2021-01-18 – 2021-01-24 (×6): 10 mg via ORAL
  Filled 2021-01-17 (×7): qty 1

## 2021-01-17 MED ORDER — VANCOMYCIN HCL 1250 MG/250ML IV SOLN
1250.0000 mg | Freq: Once | INTRAVENOUS | Status: AC
  Administered 2021-01-17: 1250 mg via INTRAVENOUS
  Filled 2021-01-17: qty 250

## 2021-01-17 MED ORDER — TRAZODONE HCL 50 MG PO TABS
50.0000 mg | ORAL_TABLET | Freq: Every day | ORAL | Status: DC
  Administered 2021-01-17 – 2021-01-20 (×3): 50 mg via ORAL
  Filled 2021-01-17 (×4): qty 1

## 2021-01-17 MED ORDER — VANCOMYCIN HCL IN DEXTROSE 1-5 GM/200ML-% IV SOLN
1000.0000 mg | Freq: Once | INTRAVENOUS | Status: DC
  Filled 2021-01-17: qty 200

## 2021-01-17 MED ORDER — ENOXAPARIN SODIUM 30 MG/0.3ML IJ SOSY: 30.0000 mg | PREFILLED_SYRINGE | INTRAMUSCULAR | Status: DC

## 2021-01-17 MED ORDER — HALOPERIDOL LACTATE 5 MG/ML IJ SOLN
1.0000 mg | Freq: Four times a day (QID) | INTRAMUSCULAR | Status: AC | PRN
  Administered 2021-01-17 – 2021-01-20 (×3): 1 mg via INTRAMUSCULAR
  Filled 2021-01-17 (×3): qty 1

## 2021-01-17 MED ORDER — OXYBUTYNIN CHLORIDE 5 MG PO TABS
5.0000 mg | ORAL_TABLET | Freq: Two times a day (BID) | ORAL | Status: DC
  Administered 2021-01-17 – 2021-01-24 (×11): 5 mg via ORAL
  Filled 2021-01-17 (×16): qty 1

## 2021-01-17 MED ORDER — SODIUM CHLORIDE 0.9 % IV SOLN
2.0000 g | Freq: Once | INTRAVENOUS | Status: AC
  Administered 2021-01-17: 2 g via INTRAVENOUS
  Filled 2021-01-17: qty 2

## 2021-01-17 MED ORDER — METOPROLOL TARTRATE 5 MG/5ML IV SOLN
5.0000 mg | INTRAVENOUS | Status: DC | PRN
Start: 2021-01-17 — End: 2021-01-17

## 2021-01-17 MED ORDER — LACTATED RINGERS IV BOLUS
1000.0000 mL | Freq: Once | INTRAVENOUS | Status: AC
  Administered 2021-01-17: 1000 mL via INTRAVENOUS

## 2021-01-17 MED ORDER — METRONIDAZOLE 500 MG/100ML IV SOLN
500.0000 mg | Freq: Once | INTRAVENOUS | Status: AC
  Administered 2021-01-17: 500 mg via INTRAVENOUS
  Filled 2021-01-17: qty 100

## 2021-01-17 MED ORDER — DM-GUAIFENESIN ER 30-600 MG PO TB12: 1.0000 | ORAL_TABLET | Freq: Two times a day (BID) | ORAL | Status: DC | PRN

## 2021-01-17 MED ORDER — ONDANSETRON HCL 4 MG/2ML IJ SOLN: 4.0000 mg | Freq: Three times a day (TID) | INTRAMUSCULAR | Status: DC | PRN

## 2021-01-17 MED ORDER — ASPIRIN EC 81 MG PO TBEC
81.0000 mg | DELAYED_RELEASE_TABLET | Freq: Every day | ORAL | Status: DC
  Administered 2021-01-18 – 2021-01-24 (×6): 81 mg via ORAL
  Filled 2021-01-17 (×7): qty 1

## 2021-01-17 MED ORDER — SODIUM CHLORIDE 0.9 % IV SOLN: 2.0000 g | INTRAVENOUS | Status: DC

## 2021-01-17 MED ORDER — MEMANTINE HCL 5 MG PO TABS
5.0000 mg | ORAL_TABLET | Freq: Two times a day (BID) | ORAL | Status: DC
  Administered 2021-01-17 – 2021-01-24 (×11): 5 mg via ORAL
  Filled 2021-01-17 (×16): qty 1

## 2021-01-17 MED ORDER — VANCOMYCIN VARIABLE DOSE PER UNSTABLE RENAL FUNCTION (PHARMACIST DOSING): Status: DC

## 2021-01-17 MED ORDER — ACETAMINOPHEN 325 MG PO TABS
650.0000 mg | ORAL_TABLET | Freq: Four times a day (QID) | ORAL | Status: DC | PRN
  Administered 2021-01-23: 650 mg via ORAL
  Filled 2021-01-17: qty 2

## 2021-01-17 NOTE — ED Triage Notes (Signed)
pt arrives via ems from home for syncopy, diarrhea. Pt was suppoesed to go to [facility] today. pt recieved 369ml NS. pt on 2L [place] at this time. does not normally pt reports taking all daily meds. NAD at this time. Pt hypotensive with ems prior to arrival.  Hx of dementia, oriented to self.  MD present to assess pt

## 2021-01-17 NOTE — ED Notes (Signed)
Patient becoming more agitated. Patient attempting to get out of bed, pulling at her IVs. Patient redirected, and NT at bedside to prevent patient from getting up/falling. Patient reoriented. Admitting provider contacted for orders for PRN meds for agitation.

## 2021-01-17 NOTE — Consult Note (Signed)
CODE SEPSIS - PHARMACY COMMUNICATION  **Broad Spectrum Antibiotics should be administered within 1 hour of Sepsis diagnosis**  Time Code Sepsis Called/Page Received: 1108  Antibiotics Ordered: 1115  Time of 1st antibiotic administration: 1138  Additional action taken by pharmacy: N/A  If necessary, Name of Provider/Nurse Contacted: N/A  Lorna Dibble ,PharmD Clinical Pharmacist  01/17/2021  11:38 AM

## 2021-01-17 NOTE — H&P (Signed)
History and Physical    Amy Stanley:[id number] DOB: Dec 23, 1935 DOA: 01/17/2021  Referring MD/NP/PA:   PCP: Baxter Hire, MD   Patient coming from:  The patient is coming from home.    Chief Complaint: syncope, increased urinary frequency, cough, mild shortness of breath  HPI: Amy Stanley is a 85 y.o. female with medical history significant of hypertension, hyperlipidemia, depression, atrial fibrillation not on anticoagulants, dementia, PVC, hard of hearing, syncope, who presents with syncope, increased urinary frequency, cough, mild shortness breath.  Per her son, patient passed out after using bathroom at home. Pt was helped down without fall or any injury.  Per her son, in the past 1.5 days, patient has been having coughing and little shortness of breath, no chest pain, fever or chills.  Patient has intermittent diarrhea, no nausea, vomiting or abdominal pain.  Patient has increased urinary frequency, but no dysuria or hematuria.  Patient moves all extremities.  No facial droop or slurred speech.  Per EMS report, patient had hypotension with SBP 70s, 300 cc normal saline were given by EMS and additional 2 L of LR were given in ED.  Blood pressure improved to 114/71.  Patient has A. fib with RVR, initial heart rate was up to 180s, which improved to 80-100s in ED.  ED Course: pt was found to have WBC 17.7, lactic acid 3.3, troponin level 139, negative COVID PCR, urinalysis (hazy appearance, trace amount of leukocyte, rare bacteria, BC 11-20), BNP 307, oxygen saturation 95% on room air, which improved to 99% on 2 L oxygen, temperature normal, tachypnea with RR 24, chest x-ray showed right lower lobe infiltration.  Patient is admitted to progressive bed as inpatient.  Review of Systems: Could not reviewed accurately due to dementia.  Allergy: No Known Allergies  Past Medical History:  Diagnosis Date   Hypertension     Past Surgical History:  Procedure Laterality Date    ABDOMINAL HYSTERECTOMY     CHOLECYSTECTOMY      Social History:  reports that she has never smoked. She has never used smokeless tobacco. She reports that she does not drink alcohol and does not use drugs.  Family History: Could not be accurately reviewed due to dementia  Prior to Admission medications   Medication Sig Start Date End Date Taking? Authorizing Provider  cholestyramine (QUESTRAN) 4 g packet Take 4 g by mouth daily. Patient not taking: No sig reported 07/22/20   [provider]  cyanocobalamin (,VITAMIN B-12,) 1000 MCG/ML injection Inject 1,000 mcg into the muscle once a week. 10/28/20   [provider]  diltiazem (CARDIZEM CD) 180 MG 24 hr capsule Take 1 capsule (180 mg total) by mouth daily. 10/09/20   Wouk, Ailene Rud, MD  diltiazem [facility]) 180 MG 24 hr capsule Take by mouth. 11/01/20 11/01/21  [provider]  donepezil (ARICEPT) 10 MG tablet Take 10 mg by mouth at bedtime. Patient not taking: No sig reported 06/19/20   [provider]  escitalopram (LEXAPRO) 10 MG tablet Take 10 mg by mouth daily. 08/11/20   [provider]  lisinopril (ZESTRIL) 5 MG tablet Take 5 mg by mouth daily. 11/27/20   [provider]  memantine (NAMENDA) 5 MG tablet Take 5 mg by mouth 2 (two) times daily. 09/30/20   [provider]  memantine (NAMENDA) 5 MG tablet Take 1 tablet by mouth 2 (two) times daily. 01/14/21   [provider]  oxybutynin (DITROPAN) 5 MG tablet Take 5 mg  by mouth 2 (two) times daily. 11/09/20   [provider]  traZODone (DESYREL) 50 MG tablet Take 50 mg by mouth at bedtime. 08/30/20   [provider]    Physical Exam: Vitals:   01/17/21 1051 01/17/21 1130 01/17/21 1200 01/17/21 1230  BP: (!) 89/59 94/65 115/68 114/71  Pulse:  89 95 75  Resp:   (!) 22 (!) 24  Temp:      TempSrc:      SpO2:  93% 97% 99%  Weight:      Height:       General: Not in acute distress HEENT:       Eyes:  PERRL, EOMI, no scleral icterus.       ENT: No discharge from the ears and nose, no pharynx injection, no tonsillar enlargement.        Neck: No JVD, no bruit, no mass felt. Heme: No neck lymph node enlargement. Cardiac: S1/S2, RRR, No murmurs, No gallops or rubs. Respiratory: No rales, wheezing, rhonchi or rubs. GI: Soft, nondistended, nontender, no rebound pain, no organomegaly, BS present. GU: No hematuria Ext: No pitting leg edema bilaterally. 1+DP/PT pulse bilaterally. Musculoskeletal: No joint deformities, No joint redness or warmth, no limitation of ROM in spin. Skin: No rashes.  Neuro: Alert, oriented X to her own name and place, not to time, cranial nerves II-XII grossly intact, moves all extremities normally.  Psych: Patient is not psychotic, no suicidal or hemocidal ideation.  Labs on Admission: I have personally reviewed following labs and imaging studies  CBC: Recent Labs  Lab 01/17/21 1054  WBC 17.7*  NEUTROABS 16.7*  HGB 14.6  HCT 43.5  MCV 96.5  PLT 333   Basic Metabolic Panel: Recent Labs  Lab 01/17/21 1054  NA 137  K 4.3  CL 111  CO2 18*  GLUCOSE 158*  BUN 31*  CREATININE 1.21*  CALCIUM 9.4  MG 2.1   GFR: Estimated Creatinine Clearance: 27.9 mL/min (A) (by C-G formula based on SCr of 1.21 mg/dL (H)). Liver Function Tests: No results for input(s): AST, ALT, ALKPHOS, BILITOT, PROT, ALBUMIN in the last 168 hours. No results for input(s): LIPASE, AMYLASE in the last 168 hours. No results for input(s): AMMONIA in the last 168 hours. Coagulation Profile: No results for input(s): INR, PROTIME in the last 168 hours. Cardiac Enzymes: No results for input(s): CKTOTAL, CKMB, CKMBINDEX, TROPONINI in the last 168 hours. BNP (last 3 results) No results for input(s): PROBNP in the last 8760 hours. HbA1C: No results for input(s): HGBA1C in the last 72 hours. CBG: No results for input(s): GLUCAP in the last 168 hours. Lipid Profile: No results for input(s):  CHOL, HDL, LDLCALC, TRIG, CHOLHDL, LDLDIRECT in the last 72 hours. Thyroid Function Tests: No results for input(s): TSH, T4TOTAL, FREET4, T3FREE, THYROIDAB in the last 72 hours. Anemia Panel: No results for input(s): VITAMINB12, FOLATE, FERRITIN, TIBC, IRON, RETICCTPCT in the last 72 hours. Urine analysis:    Component Value Date/Time   COLORURINE AMBER (A) 01/17/2021 1145   APPEARANCEUR HAZY (A) 01/17/2021 1145   LABSPEC 1.027 01/17/2021 1145   PHURINE 5.0 01/17/2021 1145   GLUCOSEU NEGATIVE 01/17/2021 1145   HGBUR NEGATIVE 01/17/2021 1145   BILIRUBINUR NEGATIVE 01/17/2021 1145   KETONESUR 5 (A) 01/17/2021 1145   PROTEINUR 100 (A) 01/17/2021 1145   NITRITE NEGATIVE 01/17/2021 1145   LEUKOCYTESUR TRACE (A) 01/17/2021 1145   Sepsis Labs: @LABRCNTIP (procalcitonin:4,lacticidven:4) ) Recent Results (from the past 240 hour(s))  Resp Panel by RT-PCR (Flu  A&B, Covid) Nasopharyngeal Swab     Status: None   Collection Time: 01/17/21 10:54 AM   Specimen: Nasopharyngeal Swab; Nasopharyngeal(NP) swabs in vial transport medium  Result Value Ref Range Status   SARS Coronavirus 2 by RT PCR NEGATIVE NEGATIVE Final    Comment: (NOTE) SARS-CoV-2 target nucleic acids are NOT DETECTED.  The SARS-CoV-2 RNA is generally detectable in upper respiratory specimens during the acute phase of infection. The lowest concentration of SARS-CoV-2 viral copies this assay can detect is 138 copies/mL. A negative result does not preclude SARS-Cov-2 infection and should not be used as the sole basis for treatment or other patient management decisions. A negative result may occur with  improper specimen collection/handling, submission of specimen other than nasopharyngeal swab, presence of viral mutation(s) within the areas targeted by this assay, and inadequate number of viral copies(<138 copies/mL). A negative result must be combined with clinical observations, patient history, and epidemiological information.  The expected result is Negative.  Fact Sheet for Patients:  EntrepreneurPulse.com.au  Fact Sheet for Healthcare Providers:  IncredibleEmployment.be  This test is no t yet approved or cleared by the [place] FDA and  has been authorized for detection and/or diagnosis of SARS-CoV-2 by FDA under an Emergency Use Authorization (EUA). This EUA will remain  in effect (meaning this test can be used) for the duration of the COVID-19 declaration under Section 564(b)(1) of the Act, 21 U.S.C.section 360bbb-3(b)(1), unless the authorization is terminated  or revoked sooner.       Influenza A by PCR NEGATIVE NEGATIVE Final   Influenza B by PCR NEGATIVE NEGATIVE Final    Comment: (NOTE) The Xpert Xpress SARS-CoV-2/FLU/RSV plus assay is intended as an aid in the diagnosis of influenza from Nasopharyngeal swab specimens and should not be used as a sole basis for treatment. Nasal washings and aspirates are unacceptable for Xpert Xpress SARS-CoV-2/FLU/RSV testing.  Fact Sheet for Patients: EntrepreneurPulse.com.au  Fact Sheet for Healthcare Providers: IncredibleEmployment.be  This test is not yet approved or cleared by the [place] FDA and has been authorized for detection and/or diagnosis of SARS-CoV-2 by FDA under an Emergency Use Authorization (EUA). This EUA will remain in effect (meaning this test can be used) for the duration of the COVID-19 declaration under Section 564(b)(1) of the Act, 21 U.S.C. section 360bbb-3(b)(1), unless the authorization is terminated or revoked.  Performed at [facility], [address] [place], [place] [address]      Radiological Exams on Admission: DG Chest Portable 1 View  Result Date: 01/17/2021 CLINICAL DATA:  Shortness of breath, syncope, diarrhea, heaviness in chest, history hypertension EXAM: PORTABLE CHEST 1 VIEW COMPARISON:  Portable exam 1117 hours  compared to 10/07/2020 FINDINGS: Upper normal size of cardiac silhouette. Mediastinal contours and pulmonary vascularity normal. Atherosclerotic calcification aorta. RIGHT lower lobe infiltrate consistent with pneumonia. Remaining lungs clear. No pleural effusion or pneumothorax. Bones demineralized. IMPRESSION: RIGHT lower lobe infiltrate consistent with pneumonia. Electronically Signed   By: Lavonia Dana M.D.   On: 01/17/2021 11:55     EKG: I have personally reviewed.  Atrial fibrillation, QTC 449, RVR, LAD.  Assessment/Plan Principal Problem:   CAP (community acquired pneumonia) Active Problems:   Atrial fibrillation with rapid ventricular response (HCC)   Acute lower UTI   Syncope and collapse   Dementia (HCC)   Hypertension   Diarrhea   Severe sepsis (HCC)   Elevated troponin   AKI (acute kidney injury) ([place])   Severe sepsis due to CAP and acute  lower UTI: Patient has severe sepsis with leukocytosis with WBC 17.7, tachycardia with heart rate up to 122, tachypnea with RR 24, lactic acid is elevated 3.3.  Patient had hypotension with blood pressure done to 70s at home, 89/59 in ED, which improved to 115/75 after giving 2 L of LR.    -Admitted to progressive unit as inpatient -Please to progressive unit for observation - IV Vancomycin and cefepime (patient received 1 dose of Flagyl in ED) - Mucinex for cough  - Bronchodilators - Urine legionella and S. pneumococcal antigen - Follow up blood culture x2, sputum culture - f/u urine culture - will get Procalcitonin and trend lactic acid level per sepsis protocol - IVF: 2L of LR bolus in ED, followed by 75 mL  Atrial fibrillation with rapid ventricular response ([place]): HR was up to 180s, improved to  90-100s with IVF.  Patient is not taking anticoagulants. -hold Cardizem due to softer blood pressure.  Syncope and collapse: Etiology is not clear, possibly due to hypotension.  Patient had hypotension with blood pressure down to 70s.   Other multifactorial etiologies are also possible, including A. fib with RVR and sepsis.  Per his son, patient did not fall, no head injury.  No focal neurodeficit on physical examination. -Neuro check -IV fluid as above -PT/OT  Dementia ([place]): -[place]  Hypertension -Hold lisinopril and Cardizem due to softer blood pressure  Diarrhea -Check C. difficile and GI pathogen panel  Elevated troponin: Trop 139, 148.  No chest pain.  Possibly due to demand ischemia. -Aspirin 81 mg daily -Trend troponin -Check A1c, FLP  AKI (acute kidney injury) ([place]): Likely due to UTI -Hold lisinopril -IV fluid as above      DVT ppx: SQ Lovenox Code Status: DNR per her son Family Communication:     Yes, patient's   son by phone disposition Plan:  Anticipate discharge back to previous environment Consults called:  none Admission status and Level of care: Progressive Cardiac:    as inpt      Status is: Inpatient  Remains inpatient appropriate because:Inpatient level of care appropriate due to severity of illness  Dispo: The patient is from: Home              Anticipated d/c is to: Home              Patient currently is not medically stable to d/c.   Difficult to place patient No           Date of Service 01/17/2021    [facility]   If 7PM-7AM, please contact night-coverage [email or web address] 01/17/2021, 2:03 PM

## 2021-01-17 NOTE — ED Notes (Signed)
Upon entry to room at shift change, patient noted to have removed one of her INTs, and her brief, and was attempting to get out of bed. Patient has hx of dementia, and while easy to redirect, is not able to verbalize understanding of safety measures in place. Patient placed in mittens for her safety, and provided with direction/distraction. Patient notified that she is being admitted to the hospital, and is awaiting inpatient admission orders and MD eval.

## 2021-01-17 NOTE — Consult Note (Signed)
Pharmacy Antibiotic Note  Amy Stanley is a 85 y.o. female admitted on 01/17/2021 with pneumonia and UTI.  Pharmacy has been consulted for Vancomycin & Cefepime dosing.  Plan: **Scr: 1.21 (0.59-0.86 in 10/2020) - cefepime 2g q24h per current renal fxn but borderline for adjustment. Will CTM w/ AM labs. - vancomycin loading dose 1.25g x1 in ED; per current renal fxn next dose due in 36-48hrs. Will assess with AM renal fxn to determine dosing interval.  Height: 5\' 2"  (157.5 cm) Weight: 60 kg (132 lb 4.4 oz) IBW/kg (Calculated) : 50.1  Temp (24hrs), Avg:97.8 F (36.6 C), Min:97.8 F (36.6 C), Max:97.8 F (36.6 C)  Recent Labs  Lab 01/17/21 1054 01/17/21 1257  WBC 17.7*  --   CREATININE 1.21*  --   LATICACIDVEN 3.3* 2.7*    Estimated Creatinine Clearance: 27.9 mL/min (A) (by C-G formula based on SCr of 1.21 mg/dL (H)).    No Known Allergies  Antimicrobials this admission: [name] (6/16 >>  CFP (6/16 >>  MTZ x1 (6/16)  Dose adjustments this admission: CTM multiple borderline for adjustment  Microbiology results: 6/16 BCx: sent/pending 6/16 UCx: sent/pending  6/16 MRSA PCR: ordered  Thank you for allowing pharmacy to be a part of this patient's care.  Shanon Brow Drayk Humbarger 01/17/2021 2:26 PM

## 2021-01-17 NOTE — ED Notes (Signed)
Brief checked for stool. None noted. Patient external catheter replaced. Patient repositioned in bed.

## 2021-01-17 NOTE — ED Notes (Signed)
Patient verbally and physically aggressive with staff. Patient repositioned in bed, and provided warm blankets. Patient also redirected, and reoriented without success. Night hospitalist paged.

## 2021-01-17 NOTE — ED Notes (Signed)
Sitter at bedside. Patient redirectable and cooperative at this time.

## 2021-01-17 NOTE — ED Provider Notes (Signed)
[facility] Emergency Department Provider Note   ____________________________________________   Event Date/Time   First MD Initiated Contact with Patient 01/17/21 1033     (approximate)  I have reviewed the triage vital signs and the nursing notes.   HISTORY  Chief Complaint Loss of Consciousness and Diarrhea    HPI Amy Stanley is a 85 y.o. female with PMHx of Dementia, HTN and PAF not on anticoagulation who presents to the ED following syncopal episode.  History is limited due to patient's baseline dementia and majority of history is obtained from EMS.  EMS states that patient lives in the basement of her family's home and was attempting to go to the bathroom when she passed out and fell backwards, striking her head.  Upon arrival of the fire department, patient noted to be hypotensive with systolic BP in the 56C along with hypoxia requiring nonrebreather.  She was given IV fluids with EMS and blood pressure improved to a systolic in the 12X, she was also weaned to 2 L nasal cannula.  Patient also noted to be tachycardic to the 180s with EMS, subsequently improved to the 120s.  On arrival, patient complains of shortness of breath, but denies any pain, including chest pain.  She does states she feels like there is a fluttering in her chest but she is unsure how long this is been going on for.  She denies any fevers or cough.        Past Medical History:  Diagnosis Date   Hypertension     Patient Active Problem List   Diagnosis Date Noted   CAP (community acquired pneumonia) 01/17/2021   Mild concentric left ventricular hypertrophy (LVH) 11/01/2020   Moderate aortic regurgitation 11/01/2020   Atrial fibrillation with rapid ventricular response ([place]) 10/06/2020   Acute lower UTI 10/06/2020   Syncope and collapse 10/06/2020   Dementia ([place]) 10/06/2020   Hearing loss 09/25/2020   Decreased appetite 09/25/2020   Sundowning 09/25/2020   Diarrhea  04/10/2020   Loss of memory 09/09/2018   Depression, prolonged 08/27/2018   Insomnia 08/27/2018   Bradycardia 01/29/2017   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 Authorizing Provider  cholestyramine (QUESTRAN) 4 g packet Take 4 g by mouth daily. Patient not taking: No sig reported 07/22/20   [provider]  cyanocobalamin (,VITAMIN B-12,) 1000 MCG/ML injection Inject 1,000 mcg into the muscle once a week. 10/28/20   [provider]  diltiazem (CARDIZEM CD) 180 MG 24 hr capsule Take 1 capsule (180 mg total) by mouth daily. 10/09/20   Wouk, Ailene Rud, MD  diltiazem [facility]) 180 MG 24 hr capsule Take by mouth. 11/01/20 11/01/21  [provider]  donepezil (ARICEPT) 10 MG tablet Take 10 mg by mouth at bedtime. Patient not taking: No sig reported 06/19/20   [provider]  escitalopram (LEXAPRO) 10 MG tablet Take 10 mg by mouth daily. 08/11/20   [provider]  lisinopril (ZESTRIL) 5 MG tablet Take 5 mg by mouth daily. 11/27/20   [provider]  memantine (NAMENDA) 5 MG tablet Take 5 mg by mouth 2 (two) times daily. 09/30/20   [provider]  memantine (NAMENDA) 5 MG tablet Take 1 tablet by  mouth 2 (two) times daily. 01/14/21   [provider]  oxybutynin (DITROPAN) 5 MG tablet Take 5 mg by mouth 2 (two) times daily. 11/09/20   [provider]  traZODone (DESYREL) 50 MG tablet Take 50 mg by mouth at bedtime. 08/30/20   [provider]    Allergies Patient has no known allergies.  History reviewed. No pertinent family history.  Social History Social History   Tobacco Use   Smoking status: Never   Smokeless tobacco: Never  Substance Use Topics   Alcohol  use: Never   Drug use: Never    Review of Systems  Constitutional: No fever/chills Eyes: No visual changes. ENT: No sore throat. Cardiovascular: Denies chest pain.  Positive for palpitations. Respiratory: Positive for shortness of breath. Gastrointestinal: No abdominal pain.  No nausea, no vomiting.  No diarrhea.  No constipation. Genitourinary: Negative for dysuria. Musculoskeletal: Negative for back pain. Skin: Negative for rash. Neurological: Negative for headaches, focal weakness or numbness.  ____________________________________________   PHYSICAL EXAM:  VITAL SIGNS: ED Triage Vitals  Enc Vitals Group     BP      Pulse      Resp      Temp      Temp src      SpO2      Weight      Height      Head Circumference      Peak Flow      Pain Score      Pain Loc      Pain Edu?      Excl. in [place]?     Constitutional: Alert and oriented to person and place, but not time or situation. Eyes: Conjunctivae are normal. Head: Atraumatic. Nose: No congestion/rhinnorhea. Mouth/Throat: Mucous membranes are moist. Neck: Normal ROM, no midline cervical spine tenderness to palpation. Cardiovascular: Tachycardic, irregularly irregular rhythm. Grossly normal heart sounds.  2+ radial pulses bilaterally. Respiratory: Tachypneic with increased respiratory effort.  No retractions. Lungs with coarse crackles throughout. Gastrointestinal: Soft and nontender. No distention. Genitourinary: deferred Musculoskeletal: No lower extremity tenderness nor edema. Neurologic:  Normal speech and language. No gross focal neurologic deficits are appreciated. Skin:  Skin is warm, dry and intact. No rash noted. Psychiatric: Mood and affect are normal. Speech and behavior are normal.  ____________________________________________   LABS (all labs ordered are listed, but only abnormal results are displayed)  Labs Reviewed  CBC WITH DIFFERENTIAL/PLATELET - Abnormal; Notable for the following  components:      Result Value   WBC 17.7 (*)    Neutro Abs 16.7 (*)    Lymphs Abs 0.3 (*)    Abs Immature Granulocytes 0.11 (*)    All other components within normal limits  BASIC METABOLIC PANEL - Abnormal; Notable for the following components:   CO2 18 (*)    Glucose, Bld 158 (*)    BUN 31 (*)    Creatinine, Ser 1.21 (*)    GFR, Estimated 44 (*)    All other components within normal limits  BRAIN NATRIURETIC PEPTIDE - Abnormal; Notable for the following components:   B Natriuretic Peptide 307.4 (*)    All other components within normal limits  LACTIC ACID, PLASMA - Abnormal; Notable for the following components:   Lactic Acid, Venous 3.3 (*)    All other components within normal limits  URINALYSIS, COMPLETE (UACMP) WITH MICROSCOPIC - Abnormal; Notable for the following components:   Color, Urine AMBER (*)    APPearance HAZY (*)  Ketones, ur 5 (*)    Protein, ur 100 (*)    Leukocytes,Ua TRACE (*)    Bacteria, UA RARE (*)    All other components within normal limits  TROPONIN I (HIGH SENSITIVITY) - Abnormal; Notable for the following components:   Troponin I (High Sensitivity) 139 (*)    All other components within normal limits  RESP PANEL BY RT-PCR (FLU A&B, COVID) ARPGX2  CULTURE, BLOOD (ROUTINE X 2)  CULTURE, BLOOD (ROUTINE X 2)  URINE CULTURE  MAGNESIUM  LACTIC ACID, PLASMA  TROPONIN I (HIGH SENSITIVITY)   ____________________________________________  EKG  ED ECG REPORT I, Blake Divine, the attending physician, personally viewed and interpreted this ECG.   Date: 01/17/2021  EKG Time: 10:39  Rate: 121  Rhythm: atrial fibrillation  Axis: LAD  Intervals:left anterior fascicular block  ST&T Change: None   PROCEDURES  Procedure(s) performed (including Critical Care):  .Critical Care  Date/Time: 01/17/2021 12:57 PM Performed by: Blake Divine, MD Authorized by: Blake Divine, MD   Critical care provider statement:    Critical care time  (minutes):  45   Critical care time was exclusive of:  Separately billable procedures and treating other patients and teaching time   Critical care was necessary to treat or prevent imminent or life-threatening deterioration of the following conditions:  Sepsis and respiratory failure   Critical care was time spent personally by me on the following activities:  Discussions with consultants, evaluation of patient's response to treatment, examination of patient, ordering and performing treatments and interventions, ordering and review of laboratory studies, ordering and review of radiographic studies, pulse oximetry, re-evaluation of patient's condition, obtaining history from patient or surrogate and review of old charts   I assumed direction of critical care for this patient from another provider in my specialty: no     Care discussed with: admitting provider     ____________________________________________   INITIAL IMPRESSION / [place] / ED COURSE      85 year old female with past medical history of dementia, hypertension, and paroxysmal atrial fibrillation who presents to the ED complaining of syncopal episode, noted to be tachycardic and hypotensive with EMS.  EKG appears consistent with atrial fibrillation with rapid ventricular response, no ischemic changes noted.  Patient is also tachypneic with O2 sats in the high 80s on room air, subsequently placed on 2 L nasal cannula with improvement.  We will further assess with chest x-ray, patient does not appear obviously fluid overloaded but does have significant crackles concerning for pulmonary edema.  We will attempt to control her heart rate with metoprolol as her blood pressure allows.  ----------------------------------------- 11:18 AM on 01/17/2021 ----------------------------------------- Daughter is now at bedside and confirms that patient never hit her head, was called by family member and lowered to the ground.  We will  cancel CT head and cervical spine.  Patient's heart rate has now improved to around 100 without IV metoprolol, although she remains in atrial fibrillation.  Patient noted to have significant leukocytosis and due to concern for sepsis we will hydrate with IV fluids and start broad-spectrum antibiotics.  ----------------------------------------- 12:58 PM on 01/17/2021 ----------------------------------------- Patient's blood pressure improving following IV fluid bolus, we will give an additional 1 L of lactated Ringer's for a total of 30 cc/kg.  Chest x-ray reviewed by me and is concerning for right lower lobe pneumonia, patient currently receiving broad-spectrum antibiotics.  Heart rate is improved and her mentation also appears improved following IV fluid resuscitation.  She is maintaining  O2 sats on 4 L nasal cannula at this time, respiratory rate is also improving.  Labs remarkable for AKI and elevated troponin, likely related to sepsis.  Case discussed with hospitalist for admission.      ____________________________________________   FINAL CLINICAL IMPRESSION(S) / ED DIAGNOSES  Final diagnoses:  Syncope and collapse  Sepsis with acute hypoxic respiratory failure without septic shock, due to unspecified organism [facility])  Community acquired pneumonia of right lower lobe of lung     ED Discharge Orders     None        Note:  This document was prepared using Dragon voice recognition software and may include unintentional dictation errors.    Blake Divine, MD 01/17/21 1259

## 2021-01-17 NOTE — ED Notes (Signed)
Family (daughter) updated as to patient's status.

## 2021-01-17 NOTE — Consult Note (Signed)
PHARMACY -  BRIEF ANTIBIOTIC NOTE   Pharmacy has received consult(s) for vancomycin & cefepime from an ED provider.  The patient's profile has been reviewed for ht/wt/allergies/indication/available labs.    One time order(s) placed for: Vancomycin 1.25g x1 Cefepime 2g x1 Also receiving Flagyl IV 500mg  x1 Further antibiotics/pharmacy consults should be ordered by admitting physician if indicated.                       Thank you, Lorna Dibble 01/17/2021  11:34 AM

## 2021-01-17 NOTE — ED Notes (Signed)
Patient attempting to get out of bed, bed alarm in place. Patient confused as to her location and situation. Patient redirected and reoriented. TV remote provided. Patient calm at this time.

## 2021-01-17 NOTE — Sepsis Progress Note (Signed)
Elink monitoring code sepsis 

## 2021-01-17 NOTE — Sepsis Progress Note (Signed)
Per secure chat w/ RN, RN stated blood cultures were drawn and labeled prior to Abx administration.

## 2021-01-18 LAB — BASIC METABOLIC PANEL
Anion gap: 4 — ABNORMAL LOW (ref 5–15)
BUN: 25 mg/dL — ABNORMAL HIGH (ref 8–23)
CO2: 24 mmol/L (ref 22–32)
Calcium: 9 mg/dL (ref 8.9–10.3)
Chloride: 112 mmol/L — ABNORMAL HIGH (ref 98–111)
Creatinine, Ser: 0.69 mg/dL (ref 0.44–1.00)
GFR, Estimated: >60 mL/min (ref >60)
Glucose, Bld: 97 mg/dL (ref 70–99)
Potassium: 3.8 mmol/L (ref 3.5–5.1)
Sodium: 140 mmol/L (ref 135–145)

## 2021-01-18 LAB — CBC
HCT: 37.3 % (ref 36.0–46.0)
Hemoglobin: 12.6 g/dL (ref 12.0–15.0)
MCH: 32.6 pg (ref 26.0–34.0)
MCHC: 33.8 g/dL (ref 30.0–36.0)
MCV: 96.4 fL (ref 80.0–100.0)
Platelets: 207 10*3/uL (ref 150–400)
RBC: 3.87 MIL/uL (ref 3.87–5.11)
RDW: 13.4 % (ref 11.5–15.5)
WBC: 14.1 10*3/uL — ABNORMAL HIGH (ref 4.0–10.5)
nRBC: 0 % (ref 0.0–0.2)

## 2021-01-18 LAB — LIPID PANEL
Cholesterol: 126 mg/dL (ref 0–200)
HDL: 73 mg/dL (ref >40)
LDL Cholesterol: 46 mg/dL (ref 0–99)
Total CHOL/HDL Ratio: 1.7 RATIO
Triglycerides: 36 mg/dL (ref <150)
VLDL: 7 mg/dL (ref 0–40)

## 2021-01-18 LAB — STREP PNEUMONIAE URINARY ANTIGEN: Strep Pneumo Urinary Antigen: NEGATIVE

## 2021-01-18 LAB — LACTIC ACID, PLASMA: Lactic Acid, Venous: 1 mmol/L (ref 0.5–1.9)

## 2021-01-18 LAB — URINE CULTURE: Culture: NO GROWTH

## 2021-01-18 MED ORDER — DILTIAZEM HCL ER COATED BEADS 180 MG PO CP24
180.0000 mg | ORAL_CAPSULE | Freq: Every day | ORAL | Status: DC
  Administered 2021-01-18 – 2021-01-24 (×6): 180 mg via ORAL
  Filled 2021-01-18 (×7): qty 1

## 2021-01-18 MED ORDER — ENOXAPARIN SODIUM 40 MG/0.4ML IJ SOSY
40.0000 mg | PREFILLED_SYRINGE | INTRAMUSCULAR | Status: DC
  Administered 2021-01-19 – 2021-01-23 (×4): 40 mg via SUBCUTANEOUS
  Filled 2021-01-18 (×5): qty 0.4

## 2021-01-18 MED ORDER — SODIUM CHLORIDE 0.9 % IV SOLN
500.0000 mg | INTRAVENOUS | Status: DC
  Administered 2021-01-18 – 2021-01-20 (×3): 500 mg via INTRAVENOUS
  Filled 2021-01-18 (×4): qty 500

## 2021-01-18 MED ORDER — SODIUM CHLORIDE 0.9 % IV SOLN
1.0000 g | INTRAVENOUS | Status: DC
  Administered 2021-01-18 – 2021-01-20 (×3): 1 g via INTRAVENOUS
  Filled 2021-01-18: qty 1
  Filled 2021-01-18: qty 10
  Filled 2021-01-18: qty 1

## 2021-01-18 NOTE — ED Notes (Signed)
PT resting quietly with eyes closed, resp even and non labored.  Medications and VS deferred at this time.

## 2021-01-18 NOTE — Progress Notes (Signed)
OT Cancellation Note  Patient Details Name: Amy Stanley MRN: [id number] DOB: 1935-10-21   Cancelled Treatment:    Reason Eval/Treat Not Completed: Medical issues which prohibited therapy. OT order received and chart reviewed. Pt sleeping soundly and OT unable to arouse pt for participation. RN reports haldol given this morning secondary to agitation. OT will re-attempt when pt is next available.   Darleen Crocker, MS, OTR/L , CBIS ascom [phone]  01/18/21, 9:44 AM

## 2021-01-18 NOTE — Progress Notes (Signed)
Patient HR sustaining above 130s at rest. No rate control meds ordered at this time, Dr. Reesa Chew notified, see new orders.    01/18/21 1559  Vitals  Temp 97.8 F (36.6 C)  Temp Source Oral  BP (!) 152/95  MAP (mmHg) 113  BP Location Left Arm  BP Method Automatic  Patient Position (if appropriate) Sitting  Pulse Rate 64  Pulse Rate Source Monitor  Resp 18  MEWS COLOR  MEWS Score Color Green  Oxygen Therapy  SpO2 96 %  O2 Device Room Air  MEWS Score  MEWS Temp 0  MEWS Systolic 0  MEWS Pulse 0  MEWS RR 0  MEWS LOC 0  MEWS Score 0

## 2021-01-18 NOTE — Evaluation (Signed)
Physical Therapy Evaluation Patient Details Name: Amy Stanley MRN: [id number] DOB: 10/23/1935 Today's Date: 01/18/2021   History of Present Illness  Per MD notes, pt is a 85 y.o. female admitted on 01/17/2021 with severe sepsis due to CAP and acute lower UTI, atrial fibrillation with rapid ventricular response ([place]), syncope and collapse, and elevated troponin. PMH includes  hypertension, atrial fibrillation not on anticoagulants, dementia, PVC, hard of hearing, and syncope.  Clinical Impression  Pt was pleasant and with encouragement was willing participate during the session. Pt's SpO2 at 98% on 1L supplemental O2 upon entry, nursing approved trial on room air.Pt maintained >/=93% throughout session on room air and nursing approved leaving pt on room at end of session. Pt's HR at low 100s at rest, 120s with therapeutic exercise on EOB, max HR at 159 after ambulating 56ft, and returned to 110s post ambulation. Further ambulation deferred secondary to elevated HR. Pt required little physical assistance with functional mobility but needed min-mod verbal and tactile cueing for instruction, RW placement, and safety secondary to impulsivity. Pt will benefit from HHPT upon discharge to safely address deficits listed in patient problem list for decreased caregiver assistance and eventual return to PLOF.      Follow Up Recommendations Home health PT;Supervision/Assistance - 24 hour    Equipment Recommendations  None recommended by PT    Recommendations for Other Services       Precautions / Restrictions Precautions Precautions: Fall Restrictions Weight Bearing Restrictions: No      Mobility  Bed Mobility Overal bed mobility: Needs Assistance Bed Mobility: Supine to Sit     Supine to sit: Min assist     General bed mobility comments: HHA to perform supine to sitting on EOB.    Transfers Overall transfer level: Needs assistance Equipment used: Rolling walker (2 wheeled) Transfers:  Sit to/from Stand Sit to Stand: Min guard         General transfer comment: Verbal and tactile cue for hand placement on walker.  Ambulation/Gait Ambulation/Gait assistance: Min assist Gait Distance (Feet): 20 Feet Assistive device: Rolling walker (2 wheeled) Gait Pattern/deviations: Step-through pattern Gait velocity: decreased   General Gait Details: Assistance with turning walker to avoid obstacles and verbal cues to keep walker close.  Stairs            Wheelchair Mobility    Modified Rankin (Stroke Patients Only)       Balance Overall balance assessment: Needs assistance Sitting-balance support: Feet supported;Bilateral upper extremity supported Sitting balance-Leahy Scale: Fair   Postural control: Posterior lean Standing balance support: Bilateral upper extremity supported Standing balance-Leahy Scale: Fair                               Pertinent Vitals/Pain Pain Assessment: No/denies pain    Home Living Family/patient expects to be discharged to:: Assisted living              Home Equipment: Shower seat;Cane - quad;Walker - 2 wheels;Walker - 4 wheels Additional Comments: Per phone conversation with son, pt set up for admission to [facility] and son would like pt to d/c directly to [facility]    Prior Function Level of Independence: Needs assistance   Gait / Transfers Assistance Needed: SBA  ADL's / Homemaking Assistance Needed: Assist and no hx of falls        Hand Dominance        Extremity/Trunk Assessment  Upper Extremity Assessment Upper Extremity Assessment: Overall WFL for tasks assessed    Lower Extremity Assessment Lower Extremity Assessment: Overall WFL for tasks assessed    Cervical / Trunk Assessment Cervical / Trunk Assessment: Normal  Communication   Communication: No difficulties  Cognition Arousal/Alertness: WNL Behavior During Therapy: Impulsive Overall Cognitive Status: History of  cognitive impairments - at baseline                                 General Comments: Pt AOx1 knowing her name. Pt able to follow commands but impulsive at times and pulling at IV wrap. Pt is a poor historian secondary to dementia.      General Comments      Exercises Total Joint Exercises Long Arc Quad: AROM;Strengthening;Both;10 reps;Seated (Verbal and tacticle cueing to switch legs.) Marching in Standing: AROM;Strengthening;Both;10 reps;Seated   Assessment/Plan    PT Assessment Patient needs continued PT services  PT Problem List Decreased strength;Decreased activity tolerance;Decreased balance;Decreased mobility;Decreased cognition;Decreased knowledge of use of DME;Decreased safety awareness       PT Treatment Interventions DME instruction;Gait training;Stair training;Functional mobility training;Therapeutic activities;Therapeutic exercise;Balance training;Patient/family education    PT Goals (Current goals can be found in the Care Plan section)  Acute Rehab PT Goals PT Goal Formulation: Patient unable to participate in goal setting Time For Goal Achievement: 01/31/21 Potential to Achieve Goals: Good    Frequency Min 2X/week   Barriers to discharge        Co-evaluation               AM-PAC PT "6 Clicks" Mobility  Outcome Measure Help needed turning from your back to your side while in a flat bed without using bedrails?: None Help needed moving from lying on your back to sitting on the side of a flat bed without using bedrails?: A Little Help needed moving to and from a bed to a chair (including a wheelchair)?: A Little Help needed standing up from a chair using your arms (e.g., wheelchair or bedside chair)?: A Little Help needed to walk in hospital room?: A Little Help needed climbing 3-5 steps with a railing? : A Lot 6 Click Score: 18    End of Session Equipment Utilized During Treatment: Gait belt Activity Tolerance: Patient tolerated  treatment well Patient left: in chair;with call bell/phone within reach;with chair alarm set Nurse Communication: Mobility status (SpO2 status on room air >/=93%) PT Visit Diagnosis: Muscle weakness (generalized) (M62.81);Difficulty in walking, not elsewhere classified (R26.2);Unsteadiness on feet (R26.81)    Time: 6010-9323 PT Time Calculation (min) (ACUTE ONLY): 26 min   Charges:              Dayton Scrape SPT 01/18/21, 5:36 PM

## 2021-01-18 NOTE — Progress Notes (Signed)
Anticoagulation monitoring(Lovenox):  85yo  F ordered Lovenox 30 mg Q24h    Filed Weights   01/17/21 1047  Weight: 60 kg (132 lb 4.4 oz)   BMI 24   Lab Results  Component Value Date   CREATININE 0.69 01/18/2021   CREATININE 1.21 (H) 01/17/2021   CREATININE 0.59 10/08/2020   Estimated Creatinine Clearance: 42.1 mL/min (by C-G formula based on SCr of 0.69 mg/dL). Hemoglobin & Hematocrit     Component Value Date/Time   HGB 12.6 01/18/2021 0623   HCT 37.3 01/18/2021 0623     Per Protocol for Patient with estCrcl now >30 ml/min and BMI < 30, will transition to Lovenox 40 mg Q24h       Chinita Greenland PharmD Clinical Pharmacist 01/18/2021

## 2021-01-18 NOTE — ED Notes (Signed)
Informed RN bed assigned 

## 2021-01-18 NOTE — Progress Notes (Signed)
PROGRESS NOTE    Amy Stanley  GTX:[id number] DOB: Oct 17, 1935 DOA: 01/17/2021 PCP: Baxter Hire, MD   Brief Narrative: Taken from H&P.  Amy Stanley is a 85 y.o. female with medical history significant of hypertension, hyperlipidemia, depression, atrial fibrillation not on anticoagulants, dementia, PVC, hard of hearing, syncope, who presents with syncope, increased urinary frequency, cough, mild shortness breath.   Per her son, patient passed out after using bathroom at home. Pt was helped down without fall or any injury.  Per her son, in the past 1.5 days, patient has been having coughing and little shortness of breath, no chest pain, fever or chills.  On arrival patient was hypotensive and responded well to IV fluid, found to be in A. fib with RVR and heart rate initially improved with IV fluid.  Leukocytosis, lactic acidosis, negative COVID PCR, UA with mild leukocytosis and rare bacteria, BNP 307, chest x-ray concerning for right lower lobe infiltrate. Patient was admitted for And started on ceftriaxone and Zithromax.  Did receive 1 dose of vancomycin, cefepime and Flagyl in the ED per sepsis protocol.  Subjective: Patient was seen and examined today. She was oriented to self only.  Continue to have some cough.  Denies any other complaint.  Assessment & Plan:   Principal Problem:   CAP (community acquired pneumonia) Active Problems:   Atrial fibrillation with rapid ventricular response (HCC)   Acute lower UTI   Syncope and collapse   Dementia (HCC)   Hypertension   Diarrhea   Severe sepsis (HCC)   Elevated troponin   AKI (acute kidney injury) ([place])  Severe sepsis secondary to CAP.  Met severe sepsis criteria with leukocytosis, tachycardia, tachypnea, lactic acidosis and hypotension. Blood pressure improved with IV fluid.  Lactic acidosis resolved.  Blood and urine cultures remain negative.  Procalcitonin elevated at 1.68.  No hypoxia. -Continue with ceftriaxone and  Zithromax. -Continue with supportive care  A. fib with RVR.  Heart rate in 120s after working with PT today.  Her home dose of Cardizem was held initially due to softer blood pressure.  Not on any anticoagulation at home. -Restart home Cardizem  Syncope and collapse.  Unclear etiology can be vasovagal as occurred after using the bathroom.  Hypotension can be contributory, as well as A. fib with RVR.  No focal neurologic deficit. -PT/OT evaluation  Dementia ([place]): -[place]   Hypertension.  Blood pressure trending up. -Restart home Cardizem -We will restart home lisinopril if blood pressure remains elevated.  Diarrhea.  There was some history of intermittent diarrhea.  Patient did not had any bowel movement since in the hospital. -No need to check GI pathogen or C. Difficile  Elevated troponin.  Peaked at 148 and now trending down.  Most likely secondary to demand ischemia.  No chest pain.  AKI.  Resolved.  Objective: Vitals:   01/18/21 0456 01/18/21 1140 01/18/21 1327 01/18/21 1559  BP: 116/76 136/90 (!) 155/91 (!) 152/95  Pulse: 86 97 85 64  Resp: $Remo'16 16 16 18  'lZJFE$ Temp:   97.9 F (36.6 C) 97.8 F (36.6 C)  TempSrc:    Oral  SpO2: 95% 98% 97% 96%  Weight:   59.4 kg   Height:   '5\' 1"'$  (1.549 m)     Intake/Output Summary (Last 24 hours) at 01/18/2021 1708 Last data filed at 01/18/2021 1400 Gross per 24 hour  Intake 727.81 ml  Output --  Net 727.81 ml   Filed Weights   01/17/21 1047 01/18/21  1327  Weight: 60 kg 59.4 kg    Examination:  General exam: Frail elderly lady, appears calm and comfortable  Respiratory system: Decreased breath sound at bases, respiratory effort normal. Cardiovascular system: S1 & S2 heard, RRR.  Gastrointestinal system: Soft, nontender, nondistended, bowel sounds positive. Central nervous system: Alert and oriented to self only. No focal neurological deficits. Extremities: No edema, no cyanosis, pulses intact and symmetrical. Psychiatry:  Judgement and insight appear impaired   DVT prophylaxis: Lovenox Code Status: DNR Family Communication:  Disposition Plan:  Status is: Inpatient  Remains inpatient appropriate because:Inpatient level of care appropriate due to severity of illness  Dispo: The patient is from: Home              Anticipated d/c is to: Home              Patient currently is not medically stable to d/c.   Difficult to place patient No              Level of care: Progressive Cardiac  All the records are reviewed and case discussed with Care Management/Social Worker. Management plans discussed with the patient, nursing and they are in agreement.  Consultants:  None  Procedures:  Antimicrobials:  Ceftriaxone Zithromax  Data Reviewed: I have personally reviewed following labs and imaging studies  CBC: Recent Labs  Lab 01/17/21 1054 01/18/21 0623  WBC 17.7* 14.1*  NEUTROABS 16.7*  --   HGB 14.6 12.6  HCT 43.5 37.3  MCV 96.5 96.4  PLT 213 503   Basic Metabolic Panel: Recent Labs  Lab 01/17/21 1054 01/18/21 0623  NA 137 140  K 4.3 3.8  CL 111 112*  CO2 18* 24  GLUCOSE 158* 97  BUN 31* 25*  CREATININE 1.21* 0.69  CALCIUM 9.4 9.0  MG 2.1  --    GFR: Estimated Creatinine Clearance: 44.1 mL/min (by C-G formula based on SCr of 0.69 mg/dL). Liver Function Tests: No results for input(s): AST, ALT, ALKPHOS, BILITOT, PROT, ALBUMIN in the last 168 hours. No results for input(s): LIPASE, AMYLASE in the last 168 hours. No results for input(s): AMMONIA in the last 168 hours. Coagulation Profile: No results for input(s): INR, PROTIME in the last 168 hours. Cardiac Enzymes: No results for input(s): CKTOTAL, CKMB, CKMBINDEX, TROPONINI in the last 168 hours. BNP (last 3 results) No results for input(s): PROBNP in the last 8760 hours. HbA1C: No results for input(s): HGBA1C in the last 72 hours. CBG: No results for input(s): GLUCAP in the last 168 hours. Lipid Profile: Recent Labs     01/18/21 0623  CHOL 126  HDL 73  LDLCALC 46  TRIG 36  CHOLHDL 1.7   Thyroid Function Tests: No results for input(s): TSH, T4TOTAL, FREET4, T3FREE, THYROIDAB in the last 72 hours. Anemia Panel: No results for input(s): VITAMINB12, FOLATE, FERRITIN, TIBC, IRON, RETICCTPCT in the last 72 hours. Sepsis Labs: Recent Labs  Lab 01/17/21 1054 01/17/21 1257 01/18/21 1331  PROCALCITON  --  1.68  --   LATICACIDVEN 3.3* 2.7* 1.0    Recent Results (from the past 240 hour(s))  Resp Panel by RT-PCR (Flu A&B, Covid) Nasopharyngeal Swab     Status: None   Collection Time: 01/17/21 10:54 AM   Specimen: Nasopharyngeal Swab; Nasopharyngeal(NP) swabs in vial transport medium  Result Value Ref Range Status   SARS Coronavirus 2 by RT PCR NEGATIVE NEGATIVE Final    Comment: (NOTE) SARS-CoV-2 target nucleic acids are NOT DETECTED.  The SARS-CoV-2 RNA is generally  detectable in upper respiratory specimens during the acute phase of infection. The lowest concentration of SARS-CoV-2 viral copies this assay can detect is 138 copies/mL. A negative result does not preclude SARS-Cov-2 infection and should not be used as the sole basis for treatment or other patient management decisions. A negative result may occur with  improper specimen collection/handling, submission of specimen other than nasopharyngeal swab, presence of viral mutation(s) within the areas targeted by this assay, and inadequate number of viral copies(<138 copies/mL). A negative result must be combined with clinical observations, patient history, and epidemiological information. The expected result is Negative.  Fact Sheet for Patients:  BloggerCourse.com  Fact Sheet for Healthcare Providers:  SeriousBroker.it  This test is no t yet approved or cleared by the [place] FDA and  has been authorized for detection and/or diagnosis of SARS-CoV-2 by FDA under an Emergency Use  Authorization (EUA). This EUA will remain  in effect (meaning this test can be used) for the duration of the COVID-19 declaration under Section 564(b)(1) of the Act, 21 U.S.C.section 360bbb-3(b)(1), unless the authorization is terminated  or revoked sooner.       Influenza A by PCR NEGATIVE NEGATIVE Final   Influenza B by PCR NEGATIVE NEGATIVE Final    Comment: (NOTE) The Xpert Xpress SARS-CoV-2/FLU/RSV plus assay is intended as an aid in the diagnosis of influenza from Nasopharyngeal swab specimens and should not be used as a sole basis for treatment. Nasal washings and aspirates are unacceptable for Xpert Xpress SARS-CoV-2/FLU/RSV testing.  Fact Sheet for Patients: BloggerCourse.com  Fact Sheet for Healthcare Providers: SeriousBroker.it  This test is not yet approved or cleared by the [place] FDA and has been authorized for detection and/or diagnosis of SARS-CoV-2 by FDA under an Emergency Use Authorization (EUA). This EUA will remain in effect (meaning this test can be used) for the duration of the COVID-19 declaration under Section 564(b)(1) of the Act, 21 U.S.C. section 360bbb-3(b)(1), unless the authorization is terminated or revoked.  Performed at [facility], [address] [place], [place] [address]   Culture, blood (routine x 2)     Status: None (Preliminary result)   Collection Time: 01/17/21 11:15 AM   Specimen: BLOOD  Result Value Ref Range Status   Specimen Description BLOOD LEFT FA  Final   Special Requests   Final    BOTTLES DRAWN AEROBIC AND ANAEROBIC Blood Culture adequate volume   Culture   Final    NO GROWTH < 24 HOURS Performed at [facility], [address] [place], [place] [address]    Report Status PENDING  Incomplete  Culture, blood (routine x 2)     Status: None (Preliminary result)   Collection Time: 01/17/21 11:20 AM   Specimen: BLOOD  Result Value Ref Range  Status   Specimen Description BLOOD LEFT HAND  Final   Special Requests   Final    BOTTLES DRAWN AEROBIC AND ANAEROBIC Blood Culture adequate volume   Culture   Final    NO GROWTH < 24 HOURS Performed at [facility], [address] [place], [place] [address]    Report Status PENDING  Incomplete  Urine culture     Status: None   Collection Time: 01/17/21 11:45 AM   Specimen: Urine, Random  Result Value Ref Range Status   Specimen Description   Final    URINE, RANDOM Performed at [facility], [address], [place], [place] [address]    Special Requests  Final    NONE Performed at [facility], [address], [place], [place] [address]    Culture   Final    NO GROWTH Performed at [facility], [place] [address], [place], [place] [address]    Report Status 01/18/2021 FINAL  Final     Radiology Studies: DG Chest Portable 1 View  Result Date: 01/17/2021 CLINICAL DATA:  Shortness of breath, syncope, diarrhea, heaviness in chest, history hypertension EXAM: PORTABLE CHEST 1 VIEW COMPARISON:  Portable exam 1117 hours compared to 10/07/2020 FINDINGS: Upper normal size of cardiac silhouette. Mediastinal contours and pulmonary vascularity normal. Atherosclerotic calcification aorta. RIGHT lower lobe infiltrate consistent with pneumonia. Remaining lungs clear. No pleural effusion or pneumothorax. Bones demineralized. IMPRESSION: RIGHT lower lobe infiltrate consistent with pneumonia. Electronically Signed   By: Lavonia Dana M.D.   On: 01/17/2021 11:55    Scheduled Meds:  aspirin EC  81 mg Oral Daily   diltiazem  180 mg Oral Daily   enoxaparin (LOVENOX) injection  40 mg Subcutaneous Q24H   escitalopram  10 mg Oral Daily   memantine  5 mg Oral BID   oxybutynin  5 mg Oral BID   traZODone  50 mg Oral QHS   Continuous Infusions:  azithromycin Stopped (01/18/21 1453)   cefTRIAXone (ROCEPHIN)  IV Stopped (01/18/21 1243)   lactated ringers Stopped  (01/17/21 1846)     LOS: 1 day   Time spent: 40 minutes. More than 50% of the time was spent in counseling/coordination of care  Lorella Nimrod, MD [facility]  If 7PM-7AM, please contact night-coverage [email or web address]  01/18/2021, 5:08 PM   This record has been created using Systems analyst. Errors have been sought and corrected,but may not always be located. Such creation errors do not reflect on the standard of care.

## 2021-01-18 NOTE — ED Notes (Signed)
Lab called to collect morning labs. 

## 2021-01-18 NOTE — Progress Notes (Signed)
Per PT:  Son reports patient has a room all ready to move into at [facility] assisted living and would like her to d/c directly there once she's appropriate to d/c from the hospital.    Reps at [facility] said they would need a new FL2 prior to d/c there.   PT recommending home health at dc.   Weekend to follow up when patient medically stable to dc.   [place], [place]

## 2021-01-18 NOTE — Plan of Care (Signed)
  Problem: Activity: Goal: Ability to tolerate increased activity will improve Outcome: Progressing   Problem: Clinical Measurements: Goal: Ability to maintain a body temperature in the normal range will improve Outcome: Progressing   Problem: Respiratory: Goal: Ability to maintain adequate ventilation will improve Outcome: Progressing Goal: Ability to maintain a clear airway will improve Outcome: Progressing   

## 2021-01-19 LAB — BASIC METABOLIC PANEL
Anion gap: 4 — ABNORMAL LOW (ref 5–15)
BUN: 20 mg/dL (ref 8–23)
CO2: 26 mmol/L (ref 22–32)
Calcium: 9.1 mg/dL (ref 8.9–10.3)
Chloride: 109 mmol/L (ref 98–111)
Creatinine, Ser: 0.67 mg/dL (ref 0.44–1.00)
GFR, Estimated: >60 mL/min (ref >60)
Glucose, Bld: 99 mg/dL (ref 70–99)
Potassium: 4 mmol/L (ref 3.5–5.1)
Sodium: 139 mmol/L (ref 135–145)

## 2021-01-19 LAB — CBC
HCT: 40.8 % (ref 36.0–46.0)
Hemoglobin: 13.7 g/dL (ref 12.0–15.0)
MCH: 32.2 pg (ref 26.0–34.0)
MCHC: 33.6 g/dL (ref 30.0–36.0)
MCV: 96 fL (ref 80.0–100.0)
Platelets: 252 10*3/uL (ref 150–400)
RBC: 4.25 MIL/uL (ref 3.87–5.11)
RDW: 13.2 % (ref 11.5–15.5)
WBC: 10.8 10*3/uL — ABNORMAL HIGH (ref 4.0–10.5)
nRBC: 0 % (ref 0.0–0.2)

## 2021-01-19 LAB — HEMOGLOBIN A1C
Hgb A1c MFr Bld: 5.4 % (ref 4.8–5.6)
Mean Plasma Glucose: 108 mg/dL

## 2021-01-19 NOTE — Progress Notes (Addendum)
PROGRESS NOTE    Amy Stanley  JJO:[id number] DOB: 1936-05-12 DOA: 01/17/2021 PCP: Baxter Hire, MD   Brief Narrative: Taken from H&P.  Amy Stanley is a 85 y.o. female with medical history significant of hypertension, hyperlipidemia, depression, atrial fibrillation not on anticoagulants, dementia, PVC, hard of hearing, syncope, who presents with syncope, increased urinary frequency, cough, mild shortness breath.   Per her son, patient passed out after using bathroom at home. Pt was helped down without fall or any injury.  Per her son, in the past 1.5 days, patient has been having coughing and little shortness of breath, no chest pain, fever or chills.  On arrival patient was hypotensive and responded well to IV fluid, found to be in A. fib with RVR and heart rate initially improved with IV fluid.  Leukocytosis, lactic acidosis, negative COVID PCR, UA with mild leukocytosis and rare bacteria, BNP 307, chest x-ray concerning for right lower lobe infiltrate. Patient was admitted for And started on ceftriaxone and Zithromax.  Did receive 1 dose of vancomycin, cefepime and Flagyl in the ED per sepsis protocol.  Medically seems stable, has advanced dementia and completely dependent for ADLs.  Her assisted living facility will not be able to provide 24/7 care.  TOC is looking for options.  Apparently family was in the process of moving her to an assisted living facility when she developed worsening of shortness of breath requiring hospitalization For CAP.  Subjective: Patient remained confused, had very advanced dementia at baseline and is oriented to self only.  Son and daughter-in-law at bedside, apparently they were unable to take care of her anymore and trying to put her in assisted living facility.  She had a bed offer but they cannot provide 24 7 care which she needs.  Completely dependent for her ADLs.  Assessment & Plan:   Principal Problem:   CAP (community acquired  pneumonia) Active Problems:   Atrial fibrillation with rapid ventricular response (HCC)   Acute lower UTI   Syncope and collapse   Dementia (HCC)   Hypertension   Diarrhea   Severe sepsis (HCC)   Elevated troponin   AKI (acute kidney injury) ([place])  Severe sepsis secondary to CAP.  Met severe sepsis criteria with leukocytosis, tachycardia, tachypnea, lactic acidosis and hypotension. Blood pressure improved with IV fluid.  Lactic acidosis resolved.  Blood and urine cultures remain negative.  Procalcitonin elevated at 1.68.  No hypoxia. Some concern of aspiration per family. -Obtain swallow evaluation -Continue with ceftriaxone and Zithromax. -Continue with supportive care  A. fib with RVR.  Heart rate improved after restarting home Cardizem.   Not on any anticoagulation at home. -Continue home Cardizem  Syncope and collapse.  Unclear etiology can be vasovagal as occurred after using the bathroom.  Hypotension can be contributory, as well as A. fib with RVR.  No focal neurologic deficit. -PT/OT evaluation-still pending  Dementia ([employer]): -[place]   Hypertension.  Blood pressure within goal. -Continue home Cardizem -We will restart home lisinopril if blood pressure remains elevated.  Diarrhea.  There was some history of intermittent diarrhea.  Patient did not had any bowel movement since in the hospital. -No need to check GI pathogen or C. Difficile  Elevated troponin.  Peaked at 148 and now trending down.  Most likely secondary to demand ischemia.  No chest pain.  AKI.  Resolved.  Objective: Vitals:   01/18/21 1943 01/19/21 0408 01/19/21 0804 01/19/21 1208  BP: (!) 129/91 134/84 (!) 146/94 120/78  Pulse: (!) 109 87 88 84  Resp: _0 Temp: 98 F (36.7 C) 97.7 F (36.5 C) 98 F (36.7 C) 97.8 F (36.6 C)  TempSrc:      SpO2: (!) 89% 95% 95% 96%  Weight:      Height:        Intake/Output Summary (Last 24 hours) at 01/19/2021 1305 Last data filed at 01/19/2021  1000 Gross per 24 hour  Intake 360 ml  Output 250 ml  Net 110 ml    Filed Weights   01/17/21 1047 01/18/21 1327  Weight: 60 kg 59.4 kg    Examination:  General.  Frail but pleasant elderly lady, in no acute distress. Pulmonary.  Lungs clear bilaterally, normal respiratory effort. CV.  Regular rate and rhythm, no JVD, rub or murmur. Abdomen.  Soft, nontender, nondistended, BS positive. CNS.  Alert and oriented to self only.  No focal neurologic deficit. Extremities.  No edema, no cyanosis, pulses intact and symmetrical. Psychiatry.  Judgment and insight appears impaired.  DVT prophylaxis: Lovenox Code Status: DNR Family Communication: Discussed with son and DIL at bedside Disposition Plan:  Status is: Inpatient  Remains inpatient appropriate because:Inpatient level of care appropriate due to severity of illness  Dispo: The patient is from: Home              Anticipated d/c is to: To be determined              Patient currently is medically stable, unsafe discharge planning.   Difficult to place patient No              Level of care: Progressive Cardiac  All the records are reviewed and case discussed with Care Management/Social Worker. Management plans discussed with the patient, nursing and they are in agreement.  Consultants:  None  Procedures:  Antimicrobials:  Ceftriaxone Zithromax  Data Reviewed: I have personally reviewed following labs and imaging studies  CBC: Recent Labs  Lab 01/17/21 1054 01/18/21 0623 01/19/21 0504  WBC 17.7* 14.1* 10.8*  NEUTROABS 16.7*  --   --   HGB 14.6 12.6 13.7  HCT 43.5 37.3 40.8  MCV 96.5 96.4 96.0  PLT 213 207 846    Basic Metabolic Panel: Recent Labs  Lab 01/17/21 1054 01/18/21 0623 01/19/21 0504  NA 137 140 139  K 4.3 3.8 4.0  CL 111 112* 109  CO2 18* 24 26  GLUCOSE 158* 97 99  BUN 31* 25* 20  CREATININE 1.21* 0.69 0.67  CALCIUM 9.4 9.0 9.1  MG 2.1  --   --     GFR: Estimated Creatinine Clearance:  44.1 mL/min (by C-G formula based on SCr of 0.67 mg/dL). Liver Function Tests: No results for input(s): AST, ALT, ALKPHOS, BILITOT, PROT, ALBUMIN in the last 168 hours. No results for input(s): LIPASE, AMYLASE in the last 168 hours. No results for input(s): AMMONIA in the last 168 hours. Coagulation Profile: No results for input(s): INR, PROTIME in the last 168 hours. Cardiac Enzymes: No results for input(s): CKTOTAL, CKMB, CKMBINDEX, TROPONINI in the last 168 hours. BNP (last 3 results) No results for input(s): PROBNP in the last 8760 hours. HbA1C: Recent Labs    01/18/21 0623  HGBA1C 5.4   CBG: No results for input(s): GLUCAP in the last 168 hours. Lipid Profile: Recent Labs    01/18/21 0623  CHOL 126  HDL 73  LDLCALC 46  TRIG 36  CHOLHDL 1.7    Thyroid Function Tests:  No results for input(s): TSH, T4TOTAL, FREET4, T3FREE, THYROIDAB in the last 72 hours. Anemia Panel: No results for input(s): VITAMINB12, FOLATE, FERRITIN, TIBC, IRON, RETICCTPCT in the last 72 hours. Sepsis Labs: Recent Labs  Lab 01/17/21 1054 01/17/21 1257 01/18/21 1331  PROCALCITON  --  1.68  --   LATICACIDVEN 3.3* 2.7* 1.0     Recent Results (from the past 240 hour(s))  Resp Panel by RT-PCR (Flu A&B, Covid) Nasopharyngeal Swab     Status: None   Collection Time: 01/17/21 10:54 AM   Specimen: Nasopharyngeal Swab; Nasopharyngeal(NP) swabs in vial transport medium  Result Value Ref Range Status   SARS Coronavirus 2 by RT PCR NEGATIVE NEGATIVE Final    Comment: (NOTE) SARS-CoV-2 target nucleic acids are NOT DETECTED.  The SARS-CoV-2 RNA is generally detectable in upper respiratory specimens during the acute phase of infection. The lowest concentration of SARS-CoV-2 viral copies this assay can detect is 138 copies/mL. A negative result does not preclude SARS-Cov-2 infection and should not be used as the sole basis for treatment or other patient management decisions. A negative result may  occur with  improper specimen collection/handling, submission of specimen other than nasopharyngeal swab, presence of viral mutation(s) within the areas targeted by this assay, and inadequate number of viral copies(<138 copies/mL). A negative result must be combined with clinical observations, patient history, and epidemiological information. The expected result is Negative.  Fact Sheet for Patients:  EntrepreneurPulse.com.au  Fact Sheet for Healthcare Providers:  IncredibleEmployment.be  This test is no t yet approved or cleared by the [place] FDA and  has been authorized for detection and/or diagnosis of SARS-CoV-2 by FDA under an Emergency Use Authorization (EUA). This EUA will remain  in effect (meaning this test can be used) for the duration of the COVID-19 declaration under Section 564(b)(1) of the Act, 21 U.S.C.section 360bbb-3(b)(1), unless the authorization is terminated  or revoked sooner.       Influenza A by PCR NEGATIVE NEGATIVE Final   Influenza B by PCR NEGATIVE NEGATIVE Final    Comment: (NOTE) The Xpert Xpress SARS-CoV-2/FLU/RSV plus assay is intended as an aid in the diagnosis of influenza from Nasopharyngeal swab specimens and should not be used as a sole basis for treatment. Nasal washings and aspirates are unacceptable for Xpert Xpress SARS-CoV-2/FLU/RSV testing.  Fact Sheet for Patients: EntrepreneurPulse.com.au  Fact Sheet for Healthcare Providers: IncredibleEmployment.be  This test is not yet approved or cleared by the [place] FDA and has been authorized for detection and/or diagnosis of SARS-CoV-2 by FDA under an Emergency Use Authorization (EUA). This EUA will remain in effect (meaning this test can be used) for the duration of the COVID-19 declaration under Section 564(b)(1) of the Act, 21 U.S.C. section 360bbb-3(b)(1), unless the authorization is terminated  or revoked.  Performed at [facility], [address] [place], [place] [address]   Culture, blood (routine x 2)     Status: None (Preliminary result)   Collection Time: 01/17/21 11:15 AM   Specimen: BLOOD  Result Value Ref Range Status   Specimen Description BLOOD LEFT FA  Final   Special Requests   Final    BOTTLES DRAWN AEROBIC AND ANAEROBIC Blood Culture adequate volume   Culture   Final    NO GROWTH 2 DAYS Performed at [facility], [address] [place], [place] [address]    Report Status PENDING  Incomplete  Culture, blood (routine x 2)     Status: None (Preliminary result)  Collection Time: 01/17/21 11:20 AM   Specimen: BLOOD  Result Value Ref Range Status   Specimen Description BLOOD LEFT HAND  Final   Special Requests   Final    BOTTLES DRAWN AEROBIC AND ANAEROBIC Blood Culture adequate volume   Culture   Final    NO GROWTH 2 DAYS Performed at [facility], [address], [place], [place] [address]    Report Status PENDING  Incomplete  Urine culture     Status: None   Collection Time: 01/17/21 11:45 AM   Specimen: Urine, Random  Result Value Ref Range Status   Specimen Description   Final    URINE, RANDOM Performed at [facility], [address], [place], [place] [address]    Special Requests   Final    NONE Performed at [facility], [address], [place], [place] [address]    Culture   Final    NO GROWTH Performed at [facility], [place] [address], [place], [place] [address]    Report Status 01/18/2021 FINAL  Final      Radiology Studies: No results found.  Scheduled Meds:  aspirin EC  81 mg Oral Daily   diltiazem  180 mg Oral Daily   enoxaparin (LOVENOX) injection  40 mg Subcutaneous Q24H   escitalopram  10 mg Oral Daily   memantine  5 mg Oral BID   oxybutynin  5 mg Oral BID   traZODone  50 mg Oral QHS   Continuous Infusions:  azithromycin 500 mg (01/19/21 1006)   cefTRIAXone  (ROCEPHIN)  IV 1 g (01/19/21 0913)   lactated ringers Stopped (01/17/21 1846)     LOS: 2 days   Time spent: 35 minutes. More than 50% of the time was spent in counseling/coordination of care  Lorella Nimrod, MD [facility]  If 7PM-7AM, please contact night-coverage [email or web address]  01/19/2021, 1:05 PM   This record has been created using Systems analyst. Errors have been sought and corrected,but may not always be located. Such creation errors do not reflect on the standard of care.

## 2021-01-19 NOTE — Evaluation (Signed)
Occupational Therapy Evaluation Patient Details Name: Amy Stanley MRN: [id number] DOB: [birth year] Today's Date: 01/19/2021    History of Present Illness Per MD notes, pt is a 85 y.o. female admitted on 01/17/2021 with pneumonia and UTI. PMH includes  hypertension, atrial fibrillation not on anticoagulants, dementia, PVC, hard of hearing, and syncope.   Clinical Impression   Pt seen for OT evaluation this date in setting of acute hospitalization d/t PNA. Pt poor historian, but is able to perform fxl mobility with AD per chart review. Pt presents with general weakness, but decent mobility, primarily inhibited by poor safety awareness. Pt does requires CGA/MIN A for ADL transfers with RW for support and requires CGA for static standing at sink to perform ADLs. SETUP and cues to sequence provided for pt o perform oral care. Pt requires SUPV to return to bed and is left with all needs met and in reach and chair alarm. Will continue to follow. Anticipate pt will require STR.     Follow Up Recommendations  SNF    Equipment Recommendations  3 in 1 bedside commode;Tub/shower seat    Recommendations for Other Services       Precautions / Restrictions Precautions Precautions: Fall Restrictions Weight Bearing Restrictions: No      Mobility Bed Mobility Overal bed mobility: Needs Assistance Bed Mobility: Supine to Sit;Sit to Supine     Supine to sit: Supervision Sit to supine: Supervision   General bed mobility comments: increased time    Transfers Overall transfer level: Needs assistance Equipment used: Rolling walker (2 wheeled) Transfers: Sit to/from Stand Sit to Stand: Min guard;Min assist         General transfer comment: cues for safety/sequence with 2ww    Balance Overall balance assessment: Needs assistance Sitting-balance support: Feet supported;Bilateral upper extremity supported Sitting balance-Leahy Scale: Fair     Standing balance support: Bilateral upper  extremity supported Standing balance-Leahy Scale: Fair                             ADL either performed or assessed with clinical judgement   ADL                                         General ADL Comments: requires SETUP to MIN A For seated UB ADLs as well as sequencing cues. CGA/MIN A for standing sink-side ADLs with RW for UE support. MIN A for transfers, MIN A for seated LB ADLs including donning socks     Vision Patient Visual Report: No change from baseline       Perception     Praxis      Pertinent Vitals/Pain Pain Assessment: No/denies pain     Hand Dominance     Extremity/Trunk Assessment Upper Extremity Assessment Upper Extremity Assessment: Generalized weakness   Lower Extremity Assessment Lower Extremity Assessment: Generalized weakness       Communication Communication Communication: No difficulties   Cognition Arousal/Alertness: Awake/alert Behavior During Therapy: Impulsive;WFL for tasks assessed/performed Overall Cognitive Status: History of cognitive impairments - at baseline                                 General Comments: Pt AOx1 knowing her name. Pt able to follow commands but impulsive at times. Pt is a poor  historian secondary to dementia. Follows most simple one to two step commands with increased processing time   General Comments       Exercises     Shoulder Instructions      Home Living Family/patient expects to be discharged to:: Assisted living Living Arrangements: Children Available Help at Discharge: Family;Available 24 hours/day Type of Home: House Home Access: Stairs to enter [employer] of Steps: 3 Entrance Stairs-Rails: Left   Alternate Level Stairs-Number of Steps: pt has a mother in Museum/gallery curator down stairs with chair lift to access   Bathroom Shower/Tub: Occupational psychologist: Standard     Home Equipment: Shower seat;Cane - quad;Walker - 2  wheels;Walker - 4 wheels   Additional Comments: Per phone conversation, pt set up for admission to [facility]F and son would like pt to d/c directly to [facility]      Prior Functioning/Environment Level of Independence: Needs assistance  Gait / Transfers Assistance Needed: SBA ADL's / Homemaking Assistance Needed: Assist and no hx of falls            OT Problem List: Decreased strength;Decreased activity tolerance;Impaired balance (sitting and/or standing);Decreased cognition;Decreased safety awareness      OT Treatment/Interventions: Self-care/ADL training;DME and/or AE instruction;Therapeutic activities;Balance training;Therapeutic exercise;Energy conservation;Patient/family education    OT Goals(Current goals can be found in the care plan section) Acute Rehab OT Goals Patient Stated Goal: none stated OT Goal Formulation: Patient unable to participate in goal setting Time For Goal Achievement: 02/02/21 Potential to Achieve Goals: Good ADL Goals Pt Will Perform Lower Body Dressing: with set-up;sit to/from stand Pt Will Transfer to Toilet: with supervision;ambulating (with LRAD) Pt Will Perform Toileting - Clothing Manipulation and hygiene: sit to/from stand  OT Frequency: Min 1X/week   Barriers to D/C:            Co-evaluation              AM-PAC OT "6 Clicks" Daily Activity     Outcome Measure Help from another person eating meals?: None Help from another person taking care of personal grooming?: A Little Help from another person toileting, which includes using toliet, bedpan, or urinal?: A Lot Help from another person bathing (including washing, rinsing, drying)?: A Lot Help from another person to put on and taking off regular upper body clothing?: A Little Help from another person to put on and taking off regular lower body clothing?: A Lot 6 Click Score: 16   End of Session Equipment Utilized During Treatment: Gait belt;Rolling walker Nurse  Communication: Mobility status  Activity Tolerance: Patient tolerated treatment well Patient left: in bed;with call bell/phone within reach;with bed alarm set  OT Visit Diagnosis: Unsteadiness on feet (R26.81);Muscle weakness (generalized) (M62.81);Other symptoms and signs involving cognitive function                Time: 8250-0370 OT Time Calculation (min): 17 min Charges:  OT General Charges $OT Visit: 1 Visit OT Evaluation $OT Eval Moderate Complexity: 1 Mod OT Treatments $Self Care/Home Management : 8-22 mins  Gerrianne Scale, MS, OTR/L ascom [phone] 01/19/21, 4:57 PM

## 2021-01-19 NOTE — TOC Progression Note (Signed)
Transition of Care [facility]) - Progression Note    Patient Details  Name: Amy Stanley MRN: [id number] Date of Birth: September 01, 1935  Transition of Care [facility]) CM/SW Contact  Izola Price, RN Phone Number: 01/19/2021, 1:13 PM  Clinical Narrative:  Spoke with son regarding returning to [facility] today. Son and daughter in law on phone call and expressed serious concerns about patient's safety in the current ALF situation due to their perception of a change in level of care/needs as of today.   States that she had not yet moved in when she has the episode that caused them to call EMS. They discussed possibility of short term rehab or higher level of care until they see how she is doing. Director of [facility] is out of town this weekend.   Essentially, they stated they feel that this would not be a safe discharge destination compared to how patient was prior to episode which occurred while they were physically moving patient in. Patient had not yet spent a night in the ALF facility. They are happy with facility just concerned over her safety. They cannot take her home today as they had just moved all her belongings and equipment in to facility.   They were also told patient had swallowing issues that had new concerns about. Directed them to Medicaid.gov to review other facilities and RN CM would notify provider and request SLP consult with PT/OT evaluations as the one PT evaluation recommended HH with 24 hour supervision which is not possible in target disposition setting.   Provider messaged with these concerns.  Simmie Davies RN CM [phone]. Ed Weeks Director/Admission [phone] or [phone].         Expected Discharge Plan and Services                                                 Social Determinants of Health (SDOH) Interventions    Readmission Risk Interventions No flowsheet data found.

## 2021-01-20 MED ORDER — SODIUM CHLORIDE 0.9 % IV SOLN
3.0000 g | Freq: Four times a day (QID) | INTRAVENOUS | Status: DC
  Administered 2021-01-20 – 2021-01-22 (×6): 3 g via INTRAVENOUS
  Filled 2021-01-20 (×3): qty 8
  Filled 2021-01-20: qty 3
  Filled 2021-01-20: qty 8
  Filled 2021-01-20: qty 3
  Filled 2021-01-20 (×2): qty 8
  Filled 2021-01-20: qty 3
  Filled 2021-01-20: qty 8

## 2021-01-20 MED ORDER — LISINOPRIL 5 MG PO TABS
5.0000 mg | ORAL_TABLET | Freq: Every day | ORAL | Status: DC
  Administered 2021-01-20 – 2021-01-24 (×4): 5 mg via ORAL
  Filled 2021-01-20 (×5): qty 1

## 2021-01-20 MED ORDER — QUETIAPINE FUMARATE 25 MG PO TABS
25.0000 mg | ORAL_TABLET | Freq: Every day | ORAL | Status: DC
  Administered 2021-01-20: 25 mg via ORAL
  Filled 2021-01-20: qty 1

## 2021-01-20 NOTE — Consult Note (Signed)
Pharmacy Antibiotic Note  CHANIN FRUMKIN is a 85 y.o. female w/ h/o HTN, HLD, MDD, Afib (no on AC), Dementia, PVCs, syncope presenting with c/o syncope & admitted on 01/17/2021 with sepsis 2/2 to CAP.  Some concern for aspiration per pt's family and CTX/Azith changed to Unasyn/Azith.  Pharmacy has been consulted for Unasyn dosing.  WBC 17.7>14.1>10.8  Afeb x72hrs; PCT 1.68; lact 2.7>1.0  Plan: Initiate Unasyn 3g IV q6h  Continues azith 500mg  IV q24h  Height: 5\' 1"  (154.9 cm) Weight: 59.4 kg (131 lb) IBW/kg (Calculated) : 47.8  Temp (24hrs), Avg:97.9 F (36.6 C), Min:97.6 F (36.4 C), Max:98.4 F (36.9 C)  Recent Labs  Lab 01/17/21 1054 01/17/21 1257 01/18/21 0623 01/18/21 1331 01/19/21 0504  WBC 17.7*  --  14.1*  --  10.8*  CREATININE 1.21*  --  0.69  --  0.67  LATICACIDVEN 3.3* 2.7*  --  1.0  --     Estimated Creatinine Clearance: 44.1 mL/min (by C-G formula based on SCr of 0.67 mg/dL).    No Known Allergies  Antimicrobials this admission: Unasyn (6/19 >>  Azith (6/17>> CTX (6/17-19)  Dose adjustments this admission: CTM; adjust PRN  Microbiology results: 6/16 BCx: NGTD 6/16 UCx: NGTD  6/16 Sputum: sent/pending  6/16 MRSA PCR: sent/pending 6/16 COVID/FLU - negative  Thank you for allowing pharmacy to be a part of this patient's care.  Lorna Dibble 01/20/2021 2:34 PM

## 2021-01-20 NOTE — Progress Notes (Signed)
PROGRESS NOTE    Amy Stanley  JSR:[id number] DOB: 02/04/1936 DOA: 01/17/2021 PCP: Baxter Hire, MD   Brief Narrative: Taken from H&P.  Amy Stanley is a 85 y.o. female with medical history significant of hypertension, hyperlipidemia, depression, atrial fibrillation not on anticoagulants, dementia, PVC, hard of hearing, syncope, who presents with syncope, increased urinary frequency, cough, mild shortness breath.   Per her son, patient passed out after using bathroom at home. Pt was helped down without fall or any injury.  Per her son, in the past 1.5 days, patient has been having coughing and little shortness of breath, no chest pain, fever or chills.  On arrival patient was hypotensive and responded well to IV fluid, found to be in A. fib with RVR and heart rate initially improved with IV fluid.  Leukocytosis, lactic acidosis, negative COVID PCR, UA with mild leukocytosis and rare bacteria, BNP 307, chest x-ray concerning for right lower lobe infiltrate. Patient was admitted for And started on ceftriaxone and Zithromax.  Did receive 1 dose of vancomycin, cefepime and Flagyl in the ED per sepsis protocol.  Medically seems stable, has advanced dementia and completely dependent for ADLs.  Her assisted living facility will not be able to provide 24/7 care.  TOC is looking for options.  Apparently family was in the process of moving her to an assisted living facility when she developed worsening of shortness of breath requiring hospitalization For CAP. PT is recommending SNF placement.might need long-term care  Subjective: Patient developed some agitation overnight and pulled one of her IV.  Pleasantly confused.  Assessment & Plan:   Principal Problem:   CAP (community acquired pneumonia) Active Problems:   Atrial fibrillation with rapid ventricular response (HCC)   Acute lower UTI   Syncope and collapse   Dementia (HCC)   Hypertension   Diarrhea   Severe sepsis (HCC)    Elevated troponin   AKI (acute kidney injury) ([place])  Severe sepsis secondary to CAP.  Met severe sepsis criteria with leukocytosis, tachycardia, tachypnea, lactic acidosis and hypotension. Blood pressure improved with IV fluid.  Lactic acidosis resolved.  Blood and urine cultures remain negative.  Procalcitonin elevated at 1.68.  No hypoxia. Some concern of aspiration per family. -Obtain swallow evaluation -Switch ceftriaxone with Unasyn -Continue with Zithromax-  -Continue with supportive care  A. fib with RVR.  Heart rate improved after restarting home Cardizem.   Not on any anticoagulation at home. -Continue home Cardizem  Syncope and collapse.  Unclear etiology can be vasovagal as occurred after using the bathroom.  Hypotension can be contributory, as well as A. fib with RVR.  No focal neurologic deficit. -PT/OT evaluation-recommending SNF placement.  Sundowning.  Patient is becoming agitated overnight, which is very common in elderly especially with history of advanced dementia. -Delirium precautions -Add Seroquel at night  Dementia ([place]): -Namenda   Hypertension.  Blood pressure mildly elevated. -Continue home Cardizem -Restart home lisinopril  Diarrhea.  There was some history of intermittent diarrhea.  Patient did not had any bowel movement since in the hospital. -No need to check GI pathogen or C. Difficile  Elevated troponin.  Peaked at 148 and now trending down.  Most likely secondary to demand ischemia.  No chest pain.  AKI.  Resolved.  Objective: Vitals:   01/19/21 1432 01/19/21 1956 01/20/21 0500 01/20/21 0819  BP: 124/86 (!) 161/107 (!) 144/98 (!) 147/99  Pulse: 79 (!) 108 89 (!) 42  Resp: _0 Temp: 97.9  F (36.6 C) 97.6 F (36.4 C) 97.7 F (36.5 C) 98.4 F (36.9 C)  TempSrc:      SpO2: 95% 96% 95% 97%  Weight:      Height:        Intake/Output Summary (Last 24 hours) at 01/20/2021 8295 Last data filed at 01/19/2021 1820 Gross per 24 hour   Intake 868.68 ml  Output 250 ml  Net 618.68 ml    Filed Weights   01/17/21 1047 01/18/21 1327  Weight: 60 kg 59.4 kg    Examination:  General.  Frail elderly lady, in no acute distress. Pulmonary.  Lungs clear bilaterally, normal respiratory effort. CV.  Regular rate and rhythm, no JVD, rub or murmur. Abdomen.  Soft, nontender, nondistended, BS positive. CNS.  Alert and oriented to self only.  No focal neurologic deficit. Extremities.  No edema, no cyanosis, pulses intact and symmetrical. Psychiatry.  Judgment and insight appears impaired.   DVT prophylaxis: Lovenox Code Status: DNR Family Communication: Discussed with DIL  Disposition Plan:  Status is: Inpatient  Remains inpatient appropriate because:Inpatient level of care appropriate due to severity of illness  Dispo: The patient is from: Home              Anticipated d/c is to: To be determined              Patient currently is medically stable, unsafe discharge planning.   Difficult to place patient No              Level of care: Progressive Cardiac  All the records are reviewed and case discussed with Care Management/Social Worker. Management plans discussed with the patient, nursing and they are in agreement.  Consultants:  None  Procedures:  Antimicrobials:  Unasyn Zithromax  Data Reviewed: I have personally reviewed following labs and imaging studies  CBC: Recent Labs  Lab 01/17/21 1054 01/18/21 0623 01/19/21 0504  WBC 17.7* 14.1* 10.8*  NEUTROABS 16.7*  --   --   HGB 14.6 12.6 13.7  HCT 43.5 37.3 40.8  MCV 96.5 96.4 96.0  PLT 213 207 621    Basic Metabolic Panel: Recent Labs  Lab 01/17/21 1054 01/18/21 0623 01/19/21 0504  NA 137 140 139  K 4.3 3.8 4.0  CL 111 112* 109  CO2 18* 24 26  GLUCOSE 158* 97 99  BUN 31* 25* 20  CREATININE 1.21* 0.69 0.67  CALCIUM 9.4 9.0 9.1  MG 2.1  --   --     GFR: Estimated Creatinine Clearance: 44.1 mL/min (by C-G formula based on SCr of 0.67  mg/dL). Liver Function Tests: No results for input(s): AST, ALT, ALKPHOS, BILITOT, PROT, ALBUMIN in the last 168 hours. No results for input(s): LIPASE, AMYLASE in the last 168 hours. No results for input(s): AMMONIA in the last 168 hours. Coagulation Profile: No results for input(s): INR, PROTIME in the last 168 hours. Cardiac Enzymes: No results for input(s): CKTOTAL, CKMB, CKMBINDEX, TROPONINI in the last 168 hours. BNP (last 3 results) No results for input(s): PROBNP in the last 8760 hours. HbA1C: Recent Labs    01/18/21 0623  HGBA1C 5.4    CBG: No results for input(s): GLUCAP in the last 168 hours. Lipid Profile: Recent Labs    01/18/21 0623  CHOL 126  HDL 73  LDLCALC 46  TRIG 36  CHOLHDL 1.7    Thyroid Function Tests: No results for input(s): TSH, T4TOTAL, FREET4, T3FREE, THYROIDAB in the last 72 hours. Anemia Panel: No results for  input(s): VITAMINB12, FOLATE, FERRITIN, TIBC, IRON, RETICCTPCT in the last 72 hours. Sepsis Labs: Recent Labs  Lab 01/17/21 1054 01/17/21 1257 01/18/21 1331  PROCALCITON  --  1.68  --   LATICACIDVEN 3.3* 2.7* 1.0     Recent Results (from the past 240 hour(s))  Resp Panel by RT-PCR (Flu A&B, Covid) Nasopharyngeal Swab     Status: None   Collection Time: 01/17/21 10:54 AM   Specimen: Nasopharyngeal Swab; Nasopharyngeal(NP) swabs in vial transport medium  Result Value Ref Range Status   SARS Coronavirus 2 by RT PCR NEGATIVE NEGATIVE Final    Comment: (NOTE) SARS-CoV-2 target nucleic acids are NOT DETECTED.  The SARS-CoV-2 RNA is generally detectable in upper respiratory specimens during the acute phase of infection. The lowest concentration of SARS-CoV-2 viral copies this assay can detect is 138 copies/mL. A negative result does not preclude SARS-Cov-2 infection and should not be used as the sole basis for treatment or other patient management decisions. A negative result may occur with  improper specimen collection/handling,  submission of specimen other than nasopharyngeal swab, presence of viral mutation(s) within the areas targeted by this assay, and inadequate number of viral copies(<138 copies/mL). A negative result must be combined with clinical observations, patient history, and epidemiological information. The expected result is Negative.  Fact Sheet for Patients:  EntrepreneurPulse.com.au  Fact Sheet for Healthcare Providers:  IncredibleEmployment.be  This test is no t yet approved or cleared by the [place] FDA and  has been authorized for detection and/or diagnosis of SARS-CoV-2 by FDA under an Emergency Use Authorization (EUA). This EUA will remain  in effect (meaning this test can be used) for the duration of the COVID-19 declaration under Section 564(b)(1) of the Act, 21 U.S.C.section 360bbb-3(b)(1), unless the authorization is terminated  or revoked sooner.       Influenza A by PCR NEGATIVE NEGATIVE Final   Influenza B by PCR NEGATIVE NEGATIVE Final    Comment: (NOTE) The Xpert Xpress SARS-CoV-2/FLU/RSV plus assay is intended as an aid in the diagnosis of influenza from Nasopharyngeal swab specimens and should not be used as a sole basis for treatment. Nasal washings and aspirates are unacceptable for Xpert Xpress SARS-CoV-2/FLU/RSV testing.  Fact Sheet for Patients: EntrepreneurPulse.com.au  Fact Sheet for Healthcare Providers: IncredibleEmployment.be  This test is not yet approved or cleared by the [place] FDA and has been authorized for detection and/or diagnosis of SARS-CoV-2 by FDA under an Emergency Use Authorization (EUA). This EUA will remain in effect (meaning this test can be used) for the duration of the COVID-19 declaration under Section 564(b)(1) of the Act, 21 U.S.C. section 360bbb-3(b)(1), unless the authorization is terminated or revoked.  Performed at [facility], [address] [place], [place] [address]   Culture, blood (routine x 2)     Status: None (Preliminary result)   Collection Time: 01/17/21 11:15 AM   Specimen: BLOOD  Result Value Ref Range Status   Specimen Description BLOOD LEFT FA  Final   Special Requests   Final    BOTTLES DRAWN AEROBIC AND ANAEROBIC Blood Culture adequate volume   Culture   Final    NO GROWTH 3 DAYS Performed at [facility], [address] [place], [place] [address]    Report Status PENDING  Incomplete  Culture, blood (routine x 2)     Status: None (Preliminary result)   Collection Time: 01/17/21 11:20 AM   Specimen: BLOOD  Result Value Ref Range Status   Specimen  Description BLOOD LEFT HAND  Final   Special Requests   Final    BOTTLES DRAWN AEROBIC AND ANAEROBIC Blood Culture adequate volume   Culture   Final    NO GROWTH 3 DAYS Performed at [facility], [address] [place], [place] [address]    Report Status PENDING  Incomplete  Urine culture     Status: None   Collection Time: 01/17/21 11:45 AM   Specimen: Urine, Random  Result Value Ref Range Status   Specimen Description   Final    URINE, RANDOM Performed at [facility], [address], [place], [place] [address]    Special Requests   Final    NONE Performed at [facility], [address], [place], [place] [address]    Culture   Final    NO GROWTH Performed at [facility], [place] [address], [place], [place] [address]    Report Status 01/18/2021 FINAL  Final      Radiology Studies: No results found.  Scheduled Meds:  aspirin EC  81 mg Oral Daily   diltiazem  180 mg Oral Daily   enoxaparin (LOVENOX) injection  40 mg Subcutaneous Q24H   escitalopram  10 mg Oral Daily   memantine  5 mg Oral BID   oxybutynin  5 mg Oral BID   traZODone  50 mg Oral QHS   Continuous Infusions:  azithromycin Stopped (01/19/21 1108)   cefTRIAXone (ROCEPHIN)  IV Stopped (01/19/21 9735)   lactated ringers  Stopped (01/17/21 1846)     LOS: 3 days   Time spent: 35 minutes. More than 50% of the time was spent in counseling/coordination of care  Lorella Nimrod, MD [facility]  If 7PM-7AM, please contact night-coverage [email or web address]  01/20/2021, 8:33 AM   This record has been created using Systems analyst. Errors have been sought and corrected,but may not always be located. Such creation errors do not reflect on the standard of care.

## 2021-01-20 NOTE — TOC Progression Note (Signed)
Transition of Care [facility]) - Progression Note    Patient Details  Name: Amy Stanley MRN: [id number] Date of Birth: 02/14/1936  [facility]) CM/SW Contact  Izola Price, RN Phone Number: 01/20/2021, 4:14 PM  Clinical Narrative:   New PT/OT evaluations ordered per provider for possible SNF vs ALF placement and family safety concerns at ALF/patient level of care change in their perception. Simmie Davies RN CM          Expected Discharge Plan and Services                                                 Social Determinants of Health (SDOH) Interventions    Readmission Risk Interventions No flowsheet data found.

## 2021-01-21 ENCOUNTER — Inpatient Hospital Stay: Payer: PPO

## 2021-01-21 LAB — CBC
HCT: 49.3 % — ABNORMAL HIGH (ref 36.0–46.0)
Hemoglobin: 16.7 g/dL — ABNORMAL HIGH (ref 12.0–15.0)
MCH: 31.6 pg (ref 26.0–34.0)
MCHC: 33.9 g/dL (ref 30.0–36.0)
MCV: 93.2 fL (ref 80.0–100.0)
Platelets: 271 10*3/uL (ref 150–400)
RBC: 5.29 MIL/uL — ABNORMAL HIGH (ref 3.87–5.11)
RDW: 12.7 % (ref 11.5–15.5)
WBC: 7.2 10*3/uL (ref 4.0–10.5)
nRBC: 0 % (ref 0.0–0.2)

## 2021-01-21 LAB — BLOOD GAS, ARTERIAL
Acid-Base Excess: 3.1 mmol/L — ABNORMAL HIGH (ref 0.0–2.0)
Bicarbonate: 25.7 mmol/L (ref 20.0–28.0)
FIO2: 0.21
O2 Saturation: 98.3 %
pCO2 arterial: 33 mmHg (ref 32.0–48.0)
pH, Arterial: 7.5 — ABNORMAL HIGH (ref 7.350–7.450)
pO2, Arterial: 81 mmHg — ABNORMAL LOW (ref 83.0–108.0)

## 2021-01-21 LAB — GLUCOSE, CAPILLARY: Glucose-Capillary: 96 mg/dL (ref 70–99)

## 2021-01-21 LAB — TROPONIN I (HIGH SENSITIVITY)
Troponin I (High Sensitivity): 17 ng/L (ref <18)
Troponin I (High Sensitivity): 19 ng/L — ABNORMAL HIGH (ref <18)

## 2021-01-21 MED ORDER — LACTATED RINGERS IV SOLN: INTRAVENOUS | Status: DC

## 2021-01-21 MED ORDER — SODIUM CHLORIDE 0.9 % IV SOLN
INTRAVENOUS | Status: DC | PRN
  Administered 2021-01-21 (×2): 250 mL via INTRAVENOUS

## 2021-01-21 MED ORDER — QUETIAPINE FUMARATE 25 MG PO TABS
12.5000 mg | ORAL_TABLET | Freq: Every day | ORAL | Status: DC
  Administered 2021-01-22 – 2021-01-23 (×2): 12.5 mg via ORAL
  Filled 2021-01-21 (×3): qty 1

## 2021-01-21 MED ORDER — AZITHROMYCIN 250 MG PO TABS
500.0000 mg | ORAL_TABLET | Freq: Every day | ORAL | Status: DC
  Administered 2021-01-22: 500 mg via ORAL
  Filled 2021-01-21 (×2): qty 2

## 2021-01-21 NOTE — Progress Notes (Signed)
Granddaughter at bedside able to get patient to drink small amount of ensure. Patient's heart rate has been elevated since sitting up and HR up to 142 but not sustaining. MD Reesa Chew made aware via secure chat about patient's heart rate.

## 2021-01-21 NOTE — Progress Notes (Signed)
Physical Therapy Treatment Patient Details Name: Amy Stanley MRN: [id number] DOB: Mar 30, 1936 Today's Date: 01/21/2021    History of Present Illness Per MD notes, pt is a 85 y.o. female admitted on 01/17/2021 with pneumonia and UTI. PMH includes  hypertension, atrial fibrillation not on anticoagulants, dementia, PVC, hard of hearing, and syncope.    PT Comments    PT arrived to pt room with son present. Pt found in bed sleeping and appears lethargic and does not arouse with conversation. Pt's son reports pt has had significant decline in function and in agreement on D/c rec to SNF. PT updated son that OT, PT and case management are in communication that PT/OT believes pt will benefit from SNF once D/c'd from hospital due to significant decline in function after pneumonia. Further mobility deferred for safety due to communication with OT that pt is very lethargic at this time. PT will f/u with pt during her hospitalization as available.   Follow Up Recommendations  SNF     Equipment Recommendations   (Defer to next level of care)    Recommendations for Other Services       Precautions / Restrictions Precautions Precautions: Fall Restrictions Weight Bearing Restrictions: No    Mobility  Bed Mobility Overal bed mobility: Needs Assistance                  Transfers Overall transfer level: Needs assistance               General transfer comment: not attempted, 2/2 to pt lethargy  Ambulation/Gait                 Stairs             Wheelchair Mobility    Modified Rankin (Stroke Patients Only)       Balance Overall balance assessment: Needs assistance   Sitting balance-Leahy Scale: Poor                                      Cognition Arousal/Alertness: Lethargic Behavior During Therapy: Flat affect Overall Cognitive Status: History of cognitive impairments - at baseline                                  General Comments: Pt very lethargic, sleeping in room and did not arouse while conversing with pt's son.      Exercises Other Exercises Other Exercises: PT discussed with son on pt's decline in mobility. Pt and PT in agreement on new DC rec to SNF.    General Comments        Pertinent Vitals/Pain Pain Assessment: No/denies pain    Home Living                      Prior Function            PT Goals (current goals can now be found in the care plan section) Acute Rehab PT Goals Patient Stated Goal: DC to SNF PT Goal Formulation: With family Time For Goal Achievement: 02/04/21 Potential to Achieve Goals: Fair    Frequency    Min 2X/week      PT Plan Discharge plan needs to be updated    Co-evaluation              AM-PAC PT "6 Clicks" Mobility  Outcome Measure  Help needed turning from your back to your side while in a flat bed without using bedrails?: Total Help needed moving from lying on your back to sitting on the side of a flat bed without using bedrails?: Total Help needed moving to and from a bed to a chair (including a wheelchair)?: Total Help needed standing up from a chair using your arms (e.g., wheelchair or bedside chair)?: Total Help needed to walk in hospital room?: Total Help needed climbing 3-5 steps with a railing? : Total 6 Click Score: 6    End of Session   Activity Tolerance: Patient limited by fatigue;Patient limited by lethargy Patient left: in bed   PT Visit Diagnosis: Muscle weakness (generalized) (M62.81);Difficulty in walking, not elsewhere classified (R26.2);Unsteadiness on feet (R26.81)     Time: 1045-1050 PT Time Calculation (min) (ACUTE ONLY): 5 min  Charges:                       Salem Caster. Fairly IV, PT, DPT Physical Therapist- [facility]  01/21/2021, 11:47 AM

## 2021-01-21 NOTE — TOC Initial Note (Signed)
Transition of Care [facility]) - Initial/Assessment Note    Patient Details  Name: Amy Stanley MRN: [id number] Date of Birth: [birth year]  [facility]) CM/SW Contact:    Alberteen Sam, LCSW Phone Number: 01/21/2021, 10:33 AM  Clinical Narrative:                  CSW spoke with patient son Ron regarding dc plan of SNF, reports he is in agreement. CSW reviewing SNF referral process, Ron agreeable for CSW to send referrals in [place] area and then get back with him on bed offers. Ron informed of requested palliative consult from MD as well.   No other questions or concerns at this time. CSW to fax referrals for bed offers.  Son Chriss Czar does report patient has 3 pfiezer covid vaccines.  Expected Discharge Plan: Skilled Nursing Facility Barriers to Discharge: Continued Medical Work up   Patient Goals and CMS Choice Patient states their goals for this hospitalization and ongoing recovery are:: to go to SNF CMS Medicare.gov Compare Post Acute Care list provided to:: Patient Represenative (must comment) (son Ron) Choice offered to / list presented to : Adult Children  Expected Discharge Plan and Services Expected Discharge Plan: [place] Acute Care Choice: [place] Living arrangements for the past 2 months: Single Family Home (was in process of moving into [facility])                                      Prior Living Arrangements/Services Living arrangements for the past 2 months: Single Family Home (was in process of moving into [facility]) Lives with:: Self Patient language and need for interpreter reviewed:: Yes        Need for Family Participation in Patient Care: Yes (Comment) Care giver support system in place?: Yes (comment)   Criminal Activity/Legal Involvement Pertinent to Current Situation/Hospitalization: No - Comment as needed  Activities of Daily Living Home Assistive Devices/Equipment: Walker  (specify type) ADL Screening (condition at time of admission) Patient's cognitive ability adequate to safely complete daily activities?: No Is the patient deaf or have difficulty hearing?: Yes Does the patient have difficulty seeing, even when wearing glasses/contacts?: No Does the patient have difficulty concentrating, remembering, or making decisions?: Yes Patient able to express need for assistance with ADLs?: No Does the patient have difficulty dressing or bathing?: Yes Independently performs ADLs?: No Does the patient have difficulty walking or climbing stairs?: No Weakness of Legs: None Weakness of Arms/Hands: None  Permission Sought/Granted      Share Information with NAME: Ron  Permission granted to share info w AGENCY: SNFs  Permission granted to share info w Relationship: son  Permission granted to share info w Contact Information: [phone]  Emotional Assessment Appearance:: Appears stated age     Orientation: : Fluctuating Orientation (Suspected and/or reported [place]) Alcohol / Substance Use: Not Applicable Psych Involvement: No (comment)  Admission diagnosis:  Syncope and collapse [R55] CAP (community acquired pneumonia) [J18.9] Community acquired pneumonia of right lower lobe of lung [J18.9] Sepsis with acute hypoxic respiratory failure without septic shock, due to unspecified organism ([place]) [A41.9, R65.20, J96.01] Patient Active Problem List   Diagnosis Date Noted   CAP (community acquired pneumonia) 01/17/2021   Severe sepsis ([place]) 01/17/2021   Elevated troponin 01/17/2021   AKI (acute kidney injury) ([place]) 01/17/2021   Mild  concentric left ventricular hypertrophy (LVH) 11/01/2020   Moderate aortic regurgitation 11/01/2020   Atrial fibrillation with rapid ventricular response ([place]) 10/06/2020   Acute lower UTI 10/06/2020   Syncope and collapse 10/06/2020   Dementia ([place]) 10/06/2020   Hearing loss 09/25/2020   Decreased appetite 09/25/2020    Sundowning 09/25/2020   Diarrhea 04/10/2020   Loss of memory 09/09/2018   Depression, prolonged 08/27/2018   Insomnia 08/27/2018   Bradycardia 01/29/2017   Frequent PVCs 11/27/2016   Palpitations 10/30/2016   Hypertension 03/09/2014   Hyperlipidemia 03/09/2014   PCP:  Baxter Hire, MD Pharmacy:   [id number] - [place], [place] [address] [place] [address] Phone: [phone] Fax: [phone]     Social Determinants of Health (SDOH) Interventions    Readmission Risk Interventions No flowsheet data found.

## 2021-01-21 NOTE — Progress Notes (Signed)
Occupational Therapy Treatment Patient Details Name: Amy Stanley MRN: [id number] DOB: November 10, 1935 Today's Date: 01/21/2021    History of present illness Per MD notes, pt is a 85 y.o. female admitted on 01/17/2021 with pneumonia and UTI. PMH includes  hypertension, atrial fibrillation not on anticoagulants, dementia, PVC, hard of hearing, and syncope.   OT comments  Today's session focused on re-evaluating discharge plans for pt, determining if SNF was necessary or if pt could safely discharge to an ALF where she had just been offered a spot. Family had moved pt's furniture and belongings into the ALF immediately prior to her hospitalization. Based on pt's generalized weakness, inability to safely perform BADLs, and cognitive impairment at present, pt currently requires 24-hour supervision and will need SNF level of care. Son present in room throughout session and in agreement. Also recommend referral for hospice eval.    Follow Up Recommendations  SNF    Equipment Recommendations  None recommended by OT    Recommendations for Other Services Other (comment) (hospice eval)    Precautions / Restrictions Precautions Precautions: Fall Restrictions Weight Bearing Restrictions: No       Mobility Bed Mobility Overal bed mobility: Needs Assistance Bed Mobility: Rolling Rolling: Max assist              Transfers Overall transfer level: Needs assistance               General transfer comment: not attempted, 2/2 to pt lethargy, safety    Balance Overall balance assessment: Needs assistance   Sitting balance-Leahy Scale: Poor                                     ADL either performed or assessed with clinical judgement   ADL Overall ADL's : Needs assistance/impaired     Grooming: Maximal assistance               Lower Body Dressing: Maximal assistance                       Vision Patient Visual Report: No change from baseline      Perception     Praxis      Cognition Arousal/Alertness: Lethargic Behavior During Therapy: Flat affect Overall Cognitive Status: History of cognitive impairments - at baseline                                 General Comments: Pt very lethargic, but is unable to state her name this AM.        Exercises Other Exercises Other Exercises: Extensive discussion with son re: DC options   Shoulder Instructions       General Comments Pt very difficult to rouse, non-communicative    Pertinent Vitals/ Pain       Pain Assessment: No/denies pain  Home Living                                          Prior Functioning/Environment              Frequency  Min 1X/week        Progress Toward Goals  OT Goals(current goals can now be found in the care plan section)  Progress towards OT  goals: Not progressing toward goals - comment  Acute Rehab OT Goals Patient Stated Goal: to move into ALF OT Goal Formulation: With family Time For Goal Achievement: 02/02/21 Potential to Achieve Goals: Poor  Plan Discharge plan remains appropriate;Frequency remains appropriate    Co-evaluation                 AM-PAC OT "6 Clicks" Daily Activity     Outcome Measure   Help from another person eating meals?: A Lot Help from another person taking care of personal grooming?: A Lot Help from another person toileting, which includes using toliet, bedpan, or urinal?: A Lot Help from another person bathing (including washing, rinsing, drying)?: A Lot Help from another person to put on and taking off regular upper body clothing?: A Lot Help from another person to put on and taking off regular lower body clothing?: A Lot 6 Click Score: 12    End of Session    OT Visit Diagnosis: Unsteadiness on feet (R26.81);Muscle weakness (generalized) (M62.81);Other symptoms and signs involving cognitive function   Activity Tolerance Patient limited by  lethargy;Patient limited by fatigue   Patient Left in bed;with call bell/phone within reach;with bed alarm set;with family/visitor present   Nurse Communication          Time: 0623-7628 OT Time Calculation (min): 29 min  Charges: OT General Charges $OT Visit: 1 Visit OT Treatments $Self Care/Home Management : 23-37 mins  Josiah Lobo, PhD, MS, OTR/L 01/21/21, 10:12 AM

## 2021-01-21 NOTE — NC FL2 (Signed)
[place] LEVEL OF CARE SCREENING TOOL     IDENTIFICATION  Patient Name: Amy Stanley Birthdate: 1935/08/14 Sex: female Admission Date (Current Location): 01/17/2021  [facility] and [place] Number:  Engineering geologist and Address:  [facility], [address], [place], [place] [address]      Provider Number: [id number]  Attending Physician Name and Address:  Lorella Nimrod, MD  Relative Name and Phone Number:  son Chriss Czar [phone]    Current Level of Care: Hospital Recommended Level of Care: [place] Prior Approval Number:    Date Approved/Denied:   PASRR Number: [id number]  Discharge Plan: SNF    Current Diagnoses: Patient Active Problem List   Diagnosis Date Noted   CAP (community acquired pneumonia) 01/17/2021   Severe sepsis ([place]) 01/17/2021   Elevated troponin 01/17/2021   AKI (acute kidney injury) ([place]) 01/17/2021   Mild concentric left ventricular hypertrophy (LVH) 11/01/2020   Moderate aortic regurgitation 11/01/2020   Atrial fibrillation with rapid ventricular response ([place]) 10/06/2020   Acute lower UTI 10/06/2020   Syncope and collapse 10/06/2020   Dementia ([place]) 10/06/2020   Hearing loss 09/25/2020   Decreased appetite 09/25/2020   Sundowning 09/25/2020   Diarrhea 04/10/2020   Loss of memory 09/09/2018   Depression, prolonged 08/27/2018   Insomnia 08/27/2018   Bradycardia 01/29/2017   Frequent PVCs 11/27/2016   Palpitations 10/30/2016   Hypertension 03/09/2014   Hyperlipidemia 03/09/2014    Orientation RESPIRATION BLADDER Height & Weight     Self  Normal Incontinent, External catheter Weight: 131 lb (59.4 kg) Height:  5\' 1"  (154.9 cm)  BEHAVIORAL SYMPTOMS/MOOD NEUROLOGICAL BOWEL NUTRITION STATUS  Other (Comment) (Memory Impairment/Follows simple commands per Unit RN)   Continent Diet (see discharge summary)  AMBULATORY STATUS COMMUNICATION OF NEEDS Skin   Extensive Assist Verbally  Normal                       Personal Care Assistance Level of Assistance  Bathing, Feeding, Dressing, Total care Bathing Assistance: Maximum assistance Feeding assistance: Maximum assistance Dressing Assistance: Maximum assistance Total Care Assistance: Maximum assistance   Functional Limitations Info  Sight, Hearing, Speech Sight Info: Adequate Hearing Info: Impaired Speech Info: Adequate    SPECIAL CARE FACTORS FREQUENCY  PT (By licensed PT), OT (By licensed OT)     PT Frequency: min 4x weekly OT Frequency: min 4x weekly            Contractures Contractures Info: Not present    Additional Factors Info  Code Status, Allergies Code Status Info: DNR Allergies Info: No Known Allergies           Current Medications (01/21/2021):  This is the current hospital active medication list Current Facility-Administered Medications  Medication Dose Route Frequency Provider Last Rate Last Admin   0.9 %  sodium chloride infusion   Intravenous PRN Lorella Nimrod, MD   Stopped at 01/21/21 1551   acetaminophen (TYLENOL) tablet 650 mg  650 mg Oral Q6H PRN Ivor Costa, MD       albuterol (PROVENTIL) (2.5 MG/3ML) 0.083% nebulizer solution 2.5 mg  2.5 mg Nebulization Q4H PRN Ivor Costa, MD       Ampicillin-Sulbactam (UNASYN) 3 g in sodium chloride 0.9 % 100 mL IVPB  3 g Intravenous Q6H Darnelle Bos, RPH   Stopped at 01/21/21 1444   aspirin EC tablet 81 mg  81 mg Oral Daily Ivor Costa, MD   81 mg at  01/20/21 0922   azithromycin (ZITHROMAX) tablet 500 mg  500 mg Oral Daily Lorella Nimrod, MD       dextromethorphan-guaiFENesin (Campbell Idaly Verret DM) 30-600 MG per 12 hr tablet 1 tablet  1 tablet Oral BID PRN Ivor Costa, MD       diltiazem (CARDIZEM CD) 24 hr capsule 180 mg  180 mg Oral Daily Lorella Nimrod, MD   180 mg at 01/20/21 0922   enoxaparin (LOVENOX) injection 40 mg  40 mg Subcutaneous Q24H Chinita Greenland A, RPH   40 mg at 01/20/21 2252   escitalopram (LEXAPRO) tablet 10 mg  10 mg Oral  Daily Ivor Costa, MD   10 mg at 01/20/21 6073   lactated ringers infusion   Intravenous Continuous Lorella Nimrod, MD 75 mL/hr at 01/21/21 1606 Infusion Verify at 01/21/21 1606   lisinopril (ZESTRIL) tablet 5 mg  5 mg Oral Daily Lorella Nimrod, MD   5 mg at 01/20/21 1638   memantine (NAMENDA) tablet 5 mg  5 mg Oral BID Ivor Costa, MD   5 mg at 01/20/21 2251   ondansetron (ZOFRAN) injection 4 mg  4 mg Intravenous Q8H PRN Ivor Costa, MD       oxybutynin (DITROPAN) tablet 5 mg  5 mg Oral BID Ivor Costa, MD   5 mg at 01/20/21 2252   QUEtiapine (SEROQUEL) tablet 12.5 mg  12.5 mg Oral QHS Lorella Nimrod, MD       traZODone (DESYREL) tablet 50 mg  50 mg Oral QHS Ivor Costa, MD   50 mg at 01/20/21 2251     Discharge Medications: Please see discharge summary for a list of discharge medications.  Relevant Imaging Results:  Relevant Lab Results:   Additional Information SSN: [id number]  Alberteen Sam, LCSW

## 2021-01-21 NOTE — Progress Notes (Signed)
Chart reviewed and Pt visited. Family in the room. They report no prior dysphagia. Pt is lethargic and not appropriate for PO's at this time. Discussed with family. ST to follow up in hopes of increased alertness Tuesday. Family agreed

## 2021-01-21 NOTE — Progress Notes (Signed)
Son Ron at bedside. Patient's morning medications held due to increased sedation and difficulty to arouse. Patient would respond when spoken to and able to squeeze this RN's hands. Stated she "just does not feel good." Unable to tell me where or what was hurting. Social Work Pricilla Riffle and MD Reesa Chew aware of patient's current state. Order for palliative care placed at this time.

## 2021-01-21 NOTE — Progress Notes (Signed)
PROGRESS NOTE    [name]  ALP:[id number] DOB: [birth year] DOA: 01/17/2021 PCP: Baxter Hire, MD   Brief Narrative: Taken from H&P.  Amy Stanley is a 85 y.o. female with medical history significant of hypertension, hyperlipidemia, depression, atrial fibrillation not on anticoagulants, dementia, PVC, hard of hearing, syncope, who presents with syncope, increased urinary frequency, cough, mild shortness breath.   Per her son, patient passed out after using bathroom at home. Pt was helped down without fall or any injury.  Per her son, in the past 1.5 days, patient has been having coughing and little shortness of breath, no chest pain, fever or chills.  On arrival patient was hypotensive and responded well to IV fluid, found to be in A. fib with RVR and heart rate initially improved with IV fluid.  Leukocytosis, lactic acidosis, negative COVID PCR, UA with mild leukocytosis and rare bacteria, BNP 307, chest x-ray concerning for right lower lobe infiltrate. Patient was admitted for And started on ceftriaxone and Zithromax.  Did receive 1 dose of vancomycin, cefepime and Flagyl in the ED per sepsis protocol.  Medically seems stable, has advanced dementia and completely dependent for ADLs.  Her assisted living facility will not be able to provide 24/7 care.  TOC is looking for options.  Apparently family was in the process of moving her to an assisted living facility when she developed worsening of shortness of breath requiring hospitalization For CAP. PT is recommending SNF placement.might need long-term care  Palliative care was consulted at son's request.  Subjective: Patient looks like in deep asleep but she was only responding to painful stimuli, received Seroquel and trazodone overnight.  Further concern of altered mental status CT head was done which is negative, ABG with mild hypoxia and CO2 of 33, rest of the labs without any significant abnormality. Son at bedside.  He was  requesting palliative care meeting.  Later patient did become more alert, remained confused with oriented to self only which is her baseline.  Assessment & Plan:   Principal Problem:   CAP (community acquired pneumonia) Active Problems:   Atrial fibrillation with rapid ventricular response (HCC)   Acute lower UTI   Syncope and collapse   Dementia (HCC)   Hypertension   Diarrhea   Severe sepsis (HCC)   Elevated troponin   AKI (acute kidney injury) ([place])  Severe sepsis secondary to CAP.  Met severe sepsis criteria with leukocytosis, tachycardia, tachypnea, lactic acidosis and hypotension. Blood pressure improved with IV fluid.  Lactic acidosis resolved.  Blood and urine cultures remain negative.  Procalcitonin elevated at 1.68.  No hypoxia. Some concern of aspiration per family.  Ceftriaxone was switched with Unasyn -Obtain swallow evaluation -Continue with Unasyn -Continue with Zithromax-  -Continue with supportive care  Altered mental status.  Patient was pretty much unresponsive to sternal rub, only withdrawing to painful stimuli but appears very comfortable when seen this morning.  CT head was obtained which was without any acute abnormality.  ABG with very mild hypoxia and CO2 of 33.  No other significant abnormalities on rest of the labs.  Most likely secondary to taking Seroquel and trazodone overnight. Later did perked up although remained confused and oriented to self only which is her baseline. -Keep monitoring -Decrease the dose of Seroquel -Decrease the dose of trazodone-Per son patient was taking trazodone for very long time and does not want to stop it.  A. fib with RVR.  Heart rate improved after restarting home Cardizem.  Not on any anticoagulation at home. -Continue home Cardizem  Syncope and collapse.  Unclear etiology can be vasovagal as occurred after using the bathroom.  Hypotension can be contributory, as well as A. fib with RVR.  No focal neurologic  deficit. -PT/OT evaluation-recommending SNF placement.  Sundowning.  Patient is becoming agitated overnight, which is very common in elderly especially with history of advanced dementia. -Delirium precautions -Add Seroquel at night-decrease the dose to 12.5 mg PRN  Dementia ([place]): -Namenda   Hypertension.   -Continue home Cardizem -Continue home lisinopril  Diarrhea.  There was some history of intermittent diarrhea.  Patient did not had any bowel movement since in the hospital. -No need to check GI pathogen or C. Difficile  Elevated troponin.  Peaked at 148 and now trending down.  Most likely secondary to demand ischemia.  No chest pain.  Repeat troponin at 17 which was done due to altered mental status this morning.  AKI.  Resolved.  Objective: Vitals:   01/20/21 1624 01/20/21 2005 01/21/21 0337 01/21/21 1241  BP: (!) 148/110 (!) 172/66 96/69 (!) 149/99  Pulse: 92 (!) 112 80 99  Resp: _0 Temp: 97.9 F (36.6 C) 98.4 F (36.9 C) 97.6 F (36.4 C) 98.8 F (37.1 C)  TempSrc: Oral  Oral   SpO2: 95% 99% 98% 93%  Weight:      Height:        Intake/Output Summary (Last 24 hours) at 01/21/2021 1401 Last data filed at 01/21/2021 1130 Gross per 24 hour  Intake 795.28 ml  Output 150 ml  Net 645.28 ml    Filed Weights   01/17/21 1047 01/18/21 1327  Weight: 60 kg 59.4 kg    Examination:  General.  Somnolent elderly lady, in no acute distress. Pulmonary.  Lungs clear bilaterally, normal respiratory effort. CV.  Regular rate and rhythm, no JVD, rub or murmur. Abdomen.  Soft, nontender, nondistended, BS positive. CNS.  Very somnolent, only responding to painful stimuli, not following any other commands. Extremities.  No edema, no cyanosis, pulses intact and symmetrical. Psychiatry.  Judgment and insight appears impaired.  DVT prophylaxis: Lovenox Code Status: DNR Family Communication: Discussed with son at bedside. Disposition Plan:  Status is:  Inpatient  Remains inpatient appropriate because:Inpatient level of care appropriate due to severity of illness  Dispo: The patient is from: Home              Anticipated d/c is to: To be determined              Patient currently is medically stable, unsafe discharge planning.   Difficult to place patient No              Level of care: Progressive Cardiac  All the records are reviewed and case discussed with Care Management/Social Worker. Management plans discussed with the patient, nursing and they are in agreement.  Consultants:  None  Procedures:  Antimicrobials:  Unasyn Zithromax  Data Reviewed: I have personally reviewed following labs and imaging studies  CBC: Recent Labs  Lab 01/17/21 1054 01/18/21 0623 01/19/21 0504 01/21/21 1104  WBC 17.7* 14.1* 10.8* 7.2  NEUTROABS 16.7*  --   --   --   HGB 14.6 12.6 13.7 16.7*  HCT 43.5 37.3 40.8 49.3*  MCV 96.5 96.4 96.0 93.2  PLT 213 207 252 161    Basic Metabolic Panel: Recent Labs  Lab 01/17/21 1054 01/18/21 0623 01/19/21 0504  NA 137 140 139  K 4.3  3.8 4.0  CL 111 112* 109  CO2 18* 24 26  GLUCOSE 158* 97 99  BUN 31* 25* 20  CREATININE 1.21* 0.69 0.67  CALCIUM 9.4 9.0 9.1  MG 2.1  --   --     GFR: Estimated Creatinine Clearance: 44.1 mL/min (by C-G formula based on SCr of 0.67 mg/dL). Liver Function Tests: No results for input(s): AST, ALT, ALKPHOS, BILITOT, PROT, ALBUMIN in the last 168 hours. No results for input(s): LIPASE, AMYLASE in the last 168 hours. No results for input(s): AMMONIA in the last 168 hours. Coagulation Profile: No results for input(s): INR, PROTIME in the last 168 hours. Cardiac Enzymes: No results for input(s): CKTOTAL, CKMB, CKMBINDEX, TROPONINI in the last 168 hours. BNP (last 3 results) No results for input(s): PROBNP in the last 8760 hours. HbA1C: No results for input(s): HGBA1C in the last 72 hours.  CBG: Recent Labs  Lab 01/21/21 1220  GLUCAP 96   Lipid  Profile: No results for input(s): CHOL, HDL, LDLCALC, TRIG, CHOLHDL, LDLDIRECT in the last 72 hours.  Thyroid Function Tests: No results for input(s): TSH, T4TOTAL, FREET4, T3FREE, THYROIDAB in the last 72 hours. Anemia Panel: No results for input(s): VITAMINB12, FOLATE, FERRITIN, TIBC, IRON, RETICCTPCT in the last 72 hours. Sepsis Labs: Recent Labs  Lab 01/17/21 1054 01/17/21 1257 01/18/21 1331  PROCALCITON  --  1.68  --   LATICACIDVEN 3.3* 2.7* 1.0     Recent Results (from the past 240 hour(s))  Resp Panel by RT-PCR (Flu A&B, Covid) Nasopharyngeal Swab     Status: None   Collection Time: 01/17/21 10:54 AM   Specimen: Nasopharyngeal Swab; Nasopharyngeal(NP) swabs in vial transport medium  Result Value Ref Range Status   SARS Coronavirus 2 by RT PCR NEGATIVE NEGATIVE Final    Comment: (NOTE) SARS-CoV-2 target nucleic acids are NOT DETECTED.  The SARS-CoV-2 RNA is generally detectable in upper respiratory specimens during the acute phase of infection. The lowest concentration of SARS-CoV-2 viral copies this assay can detect is 138 copies/mL. A negative result does not preclude SARS-Cov-2 infection and should not be used as the sole basis for treatment or other patient management decisions. A negative result may occur with  improper specimen collection/handling, submission of specimen other than nasopharyngeal swab, presence of viral mutation(s) within the areas targeted by this assay, and inadequate number of viral copies(<138 copies/mL). A negative result must be combined with clinical observations, patient history, and epidemiological information. The expected result is Negative.  Fact Sheet for Patients:  EntrepreneurPulse.com.au  Fact Sheet for Healthcare Providers:  IncredibleEmployment.be  This test is no t yet approved or cleared by the [place] FDA and  has been authorized for detection and/or diagnosis of SARS-CoV-2  by FDA under an Emergency Use Authorization (EUA). This EUA will remain  in effect (meaning this test can be used) for the duration of the COVID-19 declaration under Section 564(b)(1) of the Act, 21 U.S.C.section 360bbb-3(b)(1), unless the authorization is terminated  or revoked sooner.       Influenza A by PCR NEGATIVE NEGATIVE Final   Influenza B by PCR NEGATIVE NEGATIVE Final    Comment: (NOTE) The Xpert Xpress SARS-CoV-2/FLU/RSV plus assay is intended as an aid in the diagnosis of influenza from Nasopharyngeal swab specimens and should not be used as a sole basis for treatment. Nasal washings and aspirates are unacceptable for Xpert Xpress SARS-CoV-2/FLU/RSV testing.  Fact Sheet for Patients: EntrepreneurPulse.com.au  Fact Sheet for Healthcare Providers: IncredibleEmployment.be  This test is  not yet approved or cleared by the [place] and has been authorized for detection and/or diagnosis of SARS-CoV-2 by FDA under an Emergency Use Authorization (EUA). This EUA will remain in effect (meaning this test can be used) for the duration of the COVID-19 declaration under Section 564(b)(1) of the Act, 21 U.S.C. section 360bbb-3(b)(1), unless the authorization is terminated or revoked.  Performed at [facility], [address] [place], [place] [address]   Culture, blood (routine x 2)     Status: None (Preliminary result)   Collection Time: 01/17/21 11:15 AM   Specimen: BLOOD  Result Value Ref Range Status   Specimen Description BLOOD LEFT FA  Final   Special Requests   Final    BOTTLES DRAWN AEROBIC AND ANAEROBIC Blood Culture adequate volume   Culture   Final    NO GROWTH 3 DAYS Performed at [facility], [address] [place], [place] [address]    Report Status PENDING  Incomplete  Culture, blood (routine x 2)     Status: None (Preliminary result)   Collection Time: 01/17/21 11:20 AM   Specimen: BLOOD   Result Value Ref Range Status   Specimen Description BLOOD LEFT HAND  Final   Special Requests   Final    BOTTLES DRAWN AEROBIC AND ANAEROBIC Blood Culture adequate volume   Culture   Final    NO GROWTH 3 DAYS Performed at [facility], [address], [place], [place] [address]    Report Status PENDING  Incomplete  Urine culture     Status: None   Collection Time: 01/17/21 11:45 AM   Specimen: Urine, Random  Result Value Ref Range Status   Specimen Description   Final    URINE, RANDOM Performed at [facility], [address], [place], [place] [address]    Special Requests   Final    NONE Performed at [facility], [address] [place], [place] [address]    Culture   Final    NO GROWTH Performed at [facility], [place] [address], [place],  [address]    Report Status 01/18/2021 FINAL  Final      Radiology Studies: CT HEAD WO CONTRAST  Result Date: 01/21/2021 CLINICAL DATA:  Inpatient. Mental status change. Sedation. Difficult to arouse. No reported injury. EXAM: CT HEAD WITHOUT CONTRAST TECHNIQUE: Contiguous axial images were obtained from the base of the skull through the vertex without intravenous contrast. COMPARISON:  None. FINDINGS: Brain: Generalized mild cerebral volume loss. Nonspecific mild subcortical and periventricular white matter hypodensity, most in keeping with chronic small vessel ischemic change. No evidence of parenchymal hemorrhage or extra-axial fluid collection. No mass lesion, mass effect, or midline shift. No CT evidence of acute infarction. Cerebral volume is age appropriate. No ventriculomegaly. Vascular: No acute abnormality. Skull: No evidence of calvarial fracture. Sinuses/Orbits: The visualized paranasal sinuses are essentially clear. Other:  The mastoid air cells are unopacified. IMPRESSION: 1. No evidence of acute intracranial abnormality. 2. Generalized mild cerebral volume loss and mild chronic small  vessel ischemic changes in the cerebral white matter. Electronically Signed   By: Ilona Sorrel M.D.   On: 01/21/2021 12:15    Scheduled Meds:  aspirin EC  81 mg Oral Daily   azithromycin  500 mg Oral Daily   diltiazem  180 mg Oral Daily   enoxaparin (LOVENOX) injection  40 mg Subcutaneous Q24H   escitalopram  10 mg Oral Daily   lisinopril  5  mg Oral Daily   memantine  5 mg Oral BID   oxybutynin  5 mg Oral BID   QUEtiapine  12.5 mg Oral QHS   traZODone  50 mg Oral QHS   Continuous Infusions:  sodium chloride 10 mL/hr at 01/21/21 1130   ampicillin-sulbactam (UNASYN) IV Stopped (01/21/21 1028)     LOS: 4 days   Time spent: 40 minutes. More than 50% of the time was spent in counseling/coordination of care  Lorella Nimrod, MD [facility]  If 7PM-7AM, please contact night-coverage [email or web address]  01/21/2021, 2:01 PM   This record has been created using Systems analyst. Errors have been sought and corrected,but may not always be located. Such creation errors do not reflect on the standard of care.

## 2021-01-22 DIAGNOSIS — G309 Alzheimer's disease, unspecified: Secondary | ICD-10-CM

## 2021-01-22 DIAGNOSIS — Z7189 Other specified counseling: Secondary | ICD-10-CM

## 2021-01-22 DIAGNOSIS — F0280 Dementia in other diseases classified elsewhere without behavioral disturbance: Secondary | ICD-10-CM

## 2021-01-22 DIAGNOSIS — Z66 Do not resuscitate: Secondary | ICD-10-CM

## 2021-01-22 DIAGNOSIS — Z515 Encounter for palliative care: Secondary | ICD-10-CM

## 2021-01-22 LAB — CULTURE, BLOOD (ROUTINE X 2)
Culture: NO GROWTH
Culture: NO GROWTH
Special Requests: ADEQUATE
Special Requests: ADEQUATE

## 2021-01-22 LAB — BASIC METABOLIC PANEL
Anion gap: 6 (ref 5–15)
BUN: 25 mg/dL — ABNORMAL HIGH (ref 8–23)
CO2: 26 mmol/L (ref 22–32)
Calcium: 9.4 mg/dL (ref 8.9–10.3)
Chloride: 110 mmol/L (ref 98–111)
Creatinine, Ser: 0.76 mg/dL (ref 0.44–1.00)
GFR, Estimated: >60 mL/min (ref >60)
Glucose, Bld: 96 mg/dL (ref 70–99)
Potassium: 3.6 mmol/L (ref 3.5–5.1)
Sodium: 142 mmol/L (ref 135–145)

## 2021-01-22 MED ORDER — TRAZODONE HCL 50 MG PO TABS
25.0000 mg | ORAL_TABLET | Freq: Every day | ORAL | Status: DC
  Administered 2021-01-22 – 2021-01-23 (×2): 25 mg via ORAL
  Filled 2021-01-22 (×2): qty 1

## 2021-01-22 MED ORDER — METOPROLOL TARTRATE 25 MG PO TABS
12.5000 mg | ORAL_TABLET | Freq: Two times a day (BID) | ORAL | Status: DC
  Administered 2021-01-22 – 2021-01-24 (×5): 12.5 mg via ORAL
  Filled 2021-01-22 (×5): qty 1

## 2021-01-22 MED ORDER — AMOXICILLIN-POT CLAVULANATE 875-125 MG PO TABS
1.0000 | ORAL_TABLET | Freq: Two times a day (BID) | ORAL | Status: AC
  Administered 2021-01-22 – 2021-01-23 (×4): 1 via ORAL
  Filled 2021-01-22 (×5): qty 1

## 2021-01-22 NOTE — Consult Note (Signed)
Consultation Note Date: 01/22/2021   Patient Name: Amy Stanley  DOB: 19-May-1936  MRN: [id number]  Age / Sex: 85 y.o., female  PCP: Baxter Hire, MD Referring Physician: Lorella Nimrod, MD  Reason for Consultation: Establishing goals of care  HPI/Patient Profile: 85 y.o. female  with past medical history of dementia, hypertension, atrial fibrillation NOT on anticoagulation, hyperlipidemia, syncope, hard of hearing, depression admitted on 01/17/2021 with passed out in bathroom per son with complaints of syncope, increased urinary frequency, cough, mild shortness of breath and found to have severe sepsis with UTI. Concerns with ongoing decline in mental status and potential aspiration risk. Noted that she is followed by outpatient palliative care via [facility].   Clinical Assessment and Goals of Care: I met this morning with Ms. Storey but no family at bedside. She is awake and alert. She is able to speak with me but provides inappropriate responses and conflicting answers. She seems concerned about her care moving forward and feels her health has been a struggle but unable to provide any further details - I provided her reassurance that she is in the right place and we will help her. She is oriented only to self.   I called and spoke with son, Ron. Ron has been primary caregiver to his mother with help from aides. I explained my assessment of his mother and he notes that she is mostly at her baseline cognitive level. She is weaker and was able to ambulate independently at home and get to and from bathroom. She has just received a room at [facility] but Ron feels that his mother will need some rehab before going to [facility] so she can ambulate and toilet herself as she was prior. Ron understands that there is a chance that she will not rehab back to previous baseline and even that she could decline in the  meantime. Ron has very realistic expectations.   I discussed further with Ron his mother's dementia and progression and expectations. We discussed MOST form and I will leave form in her room for him to review. He confirms desire for DNR. He expresses that his mother would not want her life prolonged in her current state. He and his family have ongoing conversations regarding aggressiveness of care. They would like her medications minimized with a goal to focus on quality and not quantity of life. They would be open to hospice options when indicated.   All questions/concerns addressed. Emotional support provided.   Primary Decision Maker NEXT OF KIN son Ron    SUMMARY OF RECOMMENDATIONS   - DNR confirmed - Son to review MOST - Hopeful for rehab with plan to transition to [employer] from rehab - Quality of life is priority  Code Status/Advance Care Planning: DNR   Symptom Management:  Per attending. No discomfort or symptoms noted on exam.   Palliative Prophylaxis:  Aspiration, Bowel Regimen, Delirium Protocol, and Frequent Pain Assessment   Psycho-social/Spiritual:  Desire for further Chaplaincy support:no  Prognosis:  Difficult to say. Progressing  dementia but not yet hospice eligible.   Discharge Planning: [place] for rehab with Palliative care service follow-up      Primary Diagnoses: Present on Admission:  CAP (community acquired pneumonia)  Atrial fibrillation with rapid ventricular response (HCC)  Dementia ([place])  Hypertension  Syncope and collapse  Severe sepsis (HCC)  Acute lower UTI  Elevated troponin  Diarrhea  AKI (acute kidney injury) ([place])   I have reviewed the medical record, interviewed the patient and family, and examined the patient. The following aspects are pertinent.  Past Medical History:  Diagnosis Date   Hypertension    Social History   Socioeconomic History   Marital status: Widowed    Spouse name: Not on file    Number of children: Not on file   Years of education: Not on file   Highest education level: Not on file  Occupational History   Not on file  Tobacco Use   Smoking status: Never   Smokeless tobacco: Never  Substance and Sexual Activity   Alcohol use: Never   Drug use: Never   Sexual activity: Not Currently  Other Topics Concern   Not on file  Social History Narrative   Not on file   Social Determinants of Health   Financial Resource Strain: Not on file  Food Insecurity: Not on file  Transportation Needs: Not on file  Physical Activity: Not on file  Stress: Not on file  Social Connections: Not on file   History reviewed. No pertinent family history. Scheduled Meds:  aspirin EC  81 mg Oral Daily   azithromycin  500 mg Oral Daily   diltiazem  180 mg Oral Daily   enoxaparin (LOVENOX) injection  40 mg Subcutaneous Q24H   escitalopram  10 mg Oral Daily   lisinopril  5 mg Oral Daily   memantine  5 mg Oral BID   oxybutynin  5 mg Oral BID   QUEtiapine  12.5 mg Oral QHS   traZODone  50 mg Oral QHS   Continuous Infusions:  sodium chloride Stopped (01/21/21 1551)   ampicillin-sulbactam (UNASYN) IV 3 g (01/22/21 0313)   lactated ringers Stopped (01/22/21 0300)   PRN Meds:.sodium chloride, acetaminophen, albuterol, dextromethorphan-guaiFENesin, ondansetron (ZOFRAN) IV No Known Allergies Review of Systems  Unable to perform ROS: Dementia   Physical Exam Vitals and nursing note reviewed.  Constitutional:      General: She is awake. She is not in acute distress.    Appearance: She is well-groomed.  Cardiovascular:     Rate and Rhythm: Normal rate.  Pulmonary:     Effort: No tachypnea, accessory muscle usage or respiratory distress.  Abdominal:     General: Abdomen is flat.     Palpations: Abdomen is soft.  Neurological:     Mental Status: She is alert.     Comments: Oriented to person only    Vital Signs: BP (!) 151/106 (BP Location: Right Arm)   Pulse 63   Temp  98.4 F (36.9 C) (Oral)   Resp (!) 23   Ht _0  (1.549 m)   Wt 59.4 kg   SpO2 95%   BMI 24.75 kg/m  Pain Scale: 0-10   Pain Score: 0-No pain   SpO2: SpO2: 95 % O2 Device:SpO2: 95 % O2 Flow Rate: .O2 Flow Rate (L/min): 1 L/min  IO: Intake/output summary:  Intake/Output Summary (Last 24 hours) at 01/22/2021 0933 Last data filed at 01/22/2021 0600 Gross per 24 hour  Intake 586.7 ml  Output  300 ml  Net 286.7 ml    LBM:   Baseline Weight: Weight: 60 kg Most recent weight: Weight: 59.4 kg     Palliative Assessment/Data:     Time In/Out: 3524-8185, 9093-1121 Time Total: 50 min Greater than 50%  of this time was spent counseling and coordinating care related to the above assessment and plan.  Signed by: Vinie Sill, NP Palliative Medicine Team Pager # [phone] (M-F 8a-5p) Team Phone # [phone] (Nights/Weekends)

## 2021-01-22 NOTE — TOC Progression Note (Signed)
Transition of Care [facility]) - Progression Note    Patient Details  Name: Amy Stanley MRN: [id number] Date of Birth: 11/15/1935  Transition of Care [facility]) CM/SW [place], [place] Phone Number: 01/22/2021, 4:44 PM  Clinical Narrative:     CSW aware of PT updated note, CSW reached out to [facility] who reports they do not provide 24/7 supervision and would feel more comfortable with patient going to short term rehab prior to going to their ALF.   CSW has made Ron aware of other bed offer of [facility] now available since bed search expanded. Reports they will make a decision in the morning as to SNF bed.   Expected Discharge Plan: Skilled Nursing Facility Barriers to Discharge: Continued Medical Work up  Expected Discharge Plan and Services Expected Discharge Plan: [place] Choice: [place] arrangements for the past 2 months: Single Family Home (was in process of moving into [facility])                                       Social Determinants of Health (SDOH) Interventions    Readmission Risk Interventions No flowsheet data found.

## 2021-01-22 NOTE — TOC Progression Note (Signed)
Transition of Care [facility]) - Progression Note    Patient Details  Name: Amy Stanley MRN: [id number] Date of Birth: 25-Sep-1935  Transition of Care [facility]) CM/SW [place], [place] Phone Number: 01/22/2021, 4:10 PM  Clinical Narrative:     CSW notes patient has 1 bed offer at this time from [facility], son Ron informed. Ron appears distraught, reports he will do his research and determine if this facility is acceptable. Reports he will call CSW in the morning to inform. If not, Ron reports search may need to be expanded from current local search to reach more areas.   Pending response from son Ron regarding bed choice at this time.   Expected Discharge Plan: Skilled Nursing Facility Barriers to Discharge: Continued Medical Work up  Expected Discharge Plan and Services Expected Discharge Plan: [place] Choice: [place] arrangements for the past 2 months: Single Family Home (was in process of moving into [facility])                                       Social Determinants of Health (SDOH) Interventions    Readmission Risk Interventions No flowsheet data found.

## 2021-01-22 NOTE — Progress Notes (Signed)
PROGRESS NOTE    Amy Stanley  HAL:[id number] DOB: 1936-06-15 DOA: 01/17/2021 PCP: Baxter Hire, MD   Brief Narrative: Taken from H&P.  Amy Stanley is a 85 y.o. female with medical history significant of hypertension, hyperlipidemia, depression, atrial fibrillation not on anticoagulants, dementia, PVC, hard of hearing, syncope, who presents with syncope, increased urinary frequency, cough, mild shortness breath.   Per her son, patient passed out after using bathroom at home. Pt was helped down without fall or any injury.  Per her son, in the past 1.5 days, patient has been having coughing and little shortness of breath, no chest pain, fever or chills.  On arrival patient was hypotensive and responded well to IV fluid, found to be in A. fib with RVR and heart rate initially improved with IV fluid.  Leukocytosis, lactic acidosis, negative COVID PCR, UA with mild leukocytosis and rare bacteria, BNP 307, chest x-ray concerning for right lower lobe infiltrate. Patient was admitted for And started on ceftriaxone and Zithromax.  Did receive 1 dose of vancomycin, cefepime and Flagyl in the ED per sepsis protocol.  Medically seems stable, has advanced dementia and completely dependent for ADLs.  Her assisted living facility will not be able to provide 24/7 care.  TOC is looking for options.  Apparently family was in the process of moving her to an assisted living facility when she developed worsening of shortness of breath requiring hospitalization For CAP. PT is recommending SNF placement.might need long-term care  Palliative care was consulted at son's request.  Subjective: Patient was alert and eating breakfast when seen this morning.  Appears to be at her baseline.  Son in the room.  Assessment & Plan:   Principal Problem:   CAP (community acquired pneumonia) Active Problems:   Atrial fibrillation with rapid ventricular response (HCC)   Acute lower UTI   Syncope and collapse    Dementia (HCC)   Hypertension   Diarrhea   Severe sepsis (HCC)   Elevated troponin   AKI (acute kidney injury) ([place])  Severe sepsis secondary to CAP.  Met severe sepsis criteria with leukocytosis, tachycardia, tachypnea, lactic acidosis and hypotension. Blood pressure improved with IV fluid.  Lactic acidosis resolved.  Blood and urine cultures remain negative.  Procalcitonin elevated at 1.68.  No hypoxia. Some concern of aspiration per family.  Ceftriaxone was switched with Unasyn -Obtain swallow evaluation -Continue with Unasyn-complete a 5-day course -Continue with Zithromax-to complete a 3 day course -Continue with supportive care  Altered mental status.  Resolved.  Currently at baseline CT head was obtained which was without any acute abnormality.  ABG with very mild hypoxia and CO2 of 33.  No other significant abnormalities on rest of the labs.  Most likely secondary to taking Seroquel and trazodone overnight. -Keep monitoring -Decrease the dose of Seroquel -Decrease the dose of trazodone-Per son patient was taking trazodone for very long time and does not want to stop it.  A. fib with RVR.  Heart rate improved after restarting home Cardizem.   Not on any anticoagulation at home. -Continue home Cardizem  Syncope and collapse.  Unclear etiology can be vasovagal as occurred after using the bathroom.  Hypotension can be contributory, as well as A. fib with RVR.  No focal neurologic deficit. -PT/OT evaluation-recommending SNF placement.  Sundowning.  Patient is becoming agitated overnight, which is very common in elderly especially with history of advanced dementia. -Delirium precautions -Add Seroquel at night-decrease the dose to 12.5 mg PRN  Dementia (  [place]): -Namenda   Hypertension.   -Continue home Cardizem -Continue home lisinopril  Diarrhea.  There was some history of intermittent diarrhea.  Patient did not had any bowel movement since in the hospital. -No need to check  GI pathogen or C. Difficile  Elevated troponin.  Peaked at 148 and now trending down.  Most likely secondary to demand ischemia.  No chest pain.  Repeat troponin at 17 which was done due to altered mental status this morning.  AKI.  Resolved.  Objective: Vitals:   01/22/21 0444 01/22/21 0726 01/22/21 0800 01/22/21 1100  BP: (!) 131/92 (!) 145/97 (!) 151/106 (!) 133/107  Pulse: (!) 108 (!) 128 63 82  Resp: 18 17 (!) 23 (!) 22  Temp: 98.2 F (36.8 C) 98.4 F (36.9 C) 98.4 F (36.9 C) 97.8 F (36.6 C)  TempSrc: Oral Oral Oral Oral  SpO2: 93% 96% 95%   Weight:      Height:        Intake/Output Summary (Last 24 hours) at 01/22/2021 1344 Last data filed at 01/22/2021 1017 Gross per 24 hour  Intake 496.32 ml  Output 300 ml  Net 196.32 ml    Filed Weights   01/17/21 1047 01/18/21 1327  Weight: 60 kg 59.4 kg    Examination:  General.  Pleasantly confused elderly lady, in no acute distress. Pulmonary.  Lungs clear bilaterally, normal respiratory effort. CV.  Regular rate and rhythm, no JVD, rub or murmur. Abdomen.  Soft, nontender, nondistended, BS positive. CNS.  Alert and oriented to name only.  No focal neurologic deficit. Extremities.  No edema, no cyanosis, pulses intact and symmetrical. Psychiatry.  Judgment and insight appears impaired.  DVT prophylaxis: Lovenox Code Status: DNR Family Communication: Discussed with son at bedside. Disposition Plan:  Status is: Inpatient  Remains inpatient appropriate because:Inpatient level of care appropriate due to severity of illness  Dispo: The patient is from: Home              Anticipated d/c is to: To be determined              Patient currently is medically stable, unsafe discharge planning.   Difficult to place patient No              Level of care: Progressive Cardiac  All the records are reviewed and case discussed with Care Management/Social Worker. Management plans discussed with the patient, nursing and they are  in agreement.  Consultants:  None  Procedures:  Antimicrobials:  Unasyn Zithromax  Data Reviewed: I have personally reviewed following labs and imaging studies  CBC: Recent Labs  Lab 01/17/21 1054 01/18/21 0623 01/19/21 0504 01/21/21 1104  WBC 17.7* 14.1* 10.8* 7.2  NEUTROABS 16.7*  --   --   --   HGB 14.6 12.6 13.7 16.7*  HCT 43.5 37.3 40.8 49.3*  MCV 96.5 96.4 96.0 93.2  PLT 213 207 252 476    Basic Metabolic Panel: Recent Labs  Lab 01/17/21 1054 01/18/21 0623 01/19/21 0504 01/22/21 0519  NA 137 140 139 142  K 4.3 3.8 4.0 3.6  CL 111 112* 109 110  CO2 18* $Remov'24 26 26  'oCmDoo$ GLUCOSE 158* 97 99 96  BUN 31* 25* 20 25*  CREATININE 1.21* 0.69 0.67 0.76  CALCIUM 9.4 9.0 9.1 9.4  MG 2.1  --   --   --     GFR: Estimated Creatinine Clearance: 44.1 mL/min (by C-G formula based on SCr of 0.76 mg/dL). Liver Function Tests: No  results for input(s): AST, ALT, ALKPHOS, BILITOT, PROT, ALBUMIN in the last 168 hours. No results for input(s): LIPASE, AMYLASE in the last 168 hours. No results for input(s): AMMONIA in the last 168 hours. Coagulation Profile: No results for input(s): INR, PROTIME in the last 168 hours. Cardiac Enzymes: No results for input(s): CKTOTAL, CKMB, CKMBINDEX, TROPONINI in the last 168 hours. BNP (last 3 results) No results for input(s): PROBNP in the last 8760 hours. HbA1C: No results for input(s): HGBA1C in the last 72 hours.  CBG: Recent Labs  Lab 01/21/21 1220  GLUCAP 96    Lipid Profile: No results for input(s): CHOL, HDL, LDLCALC, TRIG, CHOLHDL, LDLDIRECT in the last 72 hours.  Thyroid Function Tests: No results for input(s): TSH, T4TOTAL, FREET4, T3FREE, THYROIDAB in the last 72 hours. Anemia Panel: No results for input(s): VITAMINB12, FOLATE, FERRITIN, TIBC, IRON, RETICCTPCT in the last 72 hours. Sepsis Labs: Recent Labs  Lab 01/17/21 1054 01/17/21 1257 01/18/21 1331  PROCALCITON  --  1.68  --   LATICACIDVEN 3.3* 2.7* 1.0      Recent Results (from the past 240 hour(s))  Resp Panel by RT-PCR (Flu A&B, Covid) Nasopharyngeal Swab     Status: None   Collection Time: 01/17/21 10:54 AM   Specimen: Nasopharyngeal Swab; Nasopharyngeal(NP) swabs in vial transport medium  Result Value Ref Range Status   SARS Coronavirus 2 by RT PCR NEGATIVE NEGATIVE Final    Comment: (NOTE) SARS-CoV-2 target nucleic acids are NOT DETECTED.  The SARS-CoV-2 RNA is generally detectable in upper respiratory specimens during the acute phase of infection. The lowest concentration of SARS-CoV-2 viral copies this assay can detect is 138 copies/mL. A negative result does not preclude SARS-Cov-2 infection and should not be used as the sole basis for treatment or other patient management decisions. A negative result may occur with  improper specimen collection/handling, submission of specimen other than nasopharyngeal swab, presence of viral mutation(s) within the areas targeted by this assay, and inadequate number of viral copies(<138 copies/mL). A negative result must be combined with clinical observations, patient history, and epidemiological information. The expected result is Negative.  Fact Sheet for Patients:  EntrepreneurPulse.com.au  Fact Sheet for Healthcare Providers:  IncredibleEmployment.be  This test is no t yet approved or cleared by the [place] FDA and  has been authorized for detection and/or diagnosis of SARS-CoV-2 by FDA under an Emergency Use Authorization (EUA). This EUA will remain  in effect (meaning this test can be used) for the duration of the COVID-19 declaration under Section 564(b)(1) of the Act, 21 U.S.C.section 360bbb-3(b)(1), unless the authorization is terminated  or revoked sooner.       Influenza A by PCR NEGATIVE NEGATIVE Final   Influenza B by PCR NEGATIVE NEGATIVE Final    Comment: (NOTE) The Xpert Xpress SARS-CoV-2/FLU/RSV plus assay is intended as  an aid in the diagnosis of influenza from Nasopharyngeal swab specimens and should not be used as a sole basis for treatment. Nasal washings and aspirates are unacceptable for Xpert Xpress SARS-CoV-2/FLU/RSV testing.  Fact Sheet for Patients: EntrepreneurPulse.com.au  Fact Sheet for Healthcare Providers: IncredibleEmployment.be  This test is not yet approved or cleared by the [place] FDA and has been authorized for detection and/or diagnosis of SARS-CoV-2 by FDA under an Emergency Use Authorization (EUA). This EUA will remain in effect (meaning this test can be used) for the duration of the COVID-19 declaration under Section 564(b)(1) of the Act, 21 U.S.C. section 360bbb-3(b)(1), unless the authorization is terminated  or revoked.  Performed at [facility], [address] [place], [place] [address]   Culture, blood (routine x 2)     Status: None   Collection Time: 01/17/21 11:15 AM   Specimen: BLOOD  Result Value Ref Range Status   Specimen Description BLOOD LEFT FA  Final   Special Requests   Final    BOTTLES DRAWN AEROBIC AND ANAEROBIC Blood Culture adequate volume   Culture   Final    NO GROWTH 5 DAYS Performed at [facility], [address], [place], [place] [address]    Report Status 01/22/2021 FINAL  Final  Culture, blood (routine x 2)     Status: None   Collection Time: 01/17/21 11:20 AM   Specimen: BLOOD  Result Value Ref Range Status   Specimen Description BLOOD LEFT HAND  Final   Special Requests   Final    BOTTLES DRAWN AEROBIC AND ANAEROBIC Blood Culture adequate volume   Culture   Final    NO GROWTH 5 DAYS Performed at [facility], [address] [place], [place] [address]    Report Status 01/22/2021 FINAL  Final  Urine culture     Status: None   Collection Time: 01/17/21 11:45 AM   Specimen: Urine, Random  Result Value Ref Range Status   Specimen Description   Final    URINE,  RANDOM Performed at [facility], [address] [place], [place] [address]    Special Requests   Final    NONE Performed at [facility], [address], [place], [place] [address]    Culture   Final    NO GROWTH Performed at [facility], [place] [address] [place], [place] [address]    Report Status 01/18/2021 FINAL  Final      Radiology Studies: CT HEAD WO CONTRAST  Result Date: 01/21/2021 CLINICAL DATA:  Inpatient. Mental status change. Sedation. Difficult to arouse. No reported injury. EXAM: CT HEAD WITHOUT CONTRAST TECHNIQUE: Contiguous axial images were obtained from the base of the skull through the vertex without intravenous contrast. COMPARISON:  None. FINDINGS: Brain: Generalized mild cerebral volume loss. Nonspecific mild subcortical and periventricular white matter hypodensity, most in keeping with chronic small vessel ischemic change. No evidence of parenchymal hemorrhage or extra-axial fluid collection. No mass lesion, mass effect, or midline shift. No CT evidence of acute infarction. Cerebral volume is age appropriate. No ventriculomegaly. Vascular: No acute abnormality. Skull: No evidence of calvarial fracture. Sinuses/Orbits: The visualized paranasal sinuses are essentially clear. Other:  The mastoid air cells are unopacified. IMPRESSION: 1. No evidence of acute intracranial abnormality. 2. Generalized mild cerebral volume loss and mild chronic small vessel ischemic changes in the cerebral white matter. Electronically Signed   By: Ilona Sorrel M.D.   On: 01/21/2021 12:15    Scheduled Meds:  amoxicillin-clavulanate  1 tablet Oral Q12H   aspirin EC  81 mg Oral Daily   diltiazem  180 mg Oral Daily   enoxaparin (LOVENOX) injection  40 mg Subcutaneous Q24H   escitalopram  10 mg Oral Daily   lisinopril  5 mg Oral Daily   memantine  5 mg Oral BID   metoprolol tartrate  12.5 mg Oral BID   oxybutynin  5 mg Oral BID   QUEtiapine  12.5 mg Oral QHS    traZODone  25 mg Oral QHS   Continuous Infusions:  sodium chloride Stopped (01/21/21 1551)   lactated ringers Stopped (01/22/21 0300)  LOS: 5 days   Time spent: 34 minutes. More than 50% of the time was spent in counseling/coordination of care  Lorella Nimrod, MD [facility]  If 7PM-7AM, please contact night-coverage [email or web address]  01/22/2021, 1:44 PM   This record has been created using Systems analyst. Errors have been sought and corrected,but may not always be located. Such creation errors do not reflect on the standard of care.

## 2021-01-22 NOTE — Progress Notes (Signed)
Physical Therapy Treatment Patient Details Name: Amy Stanley MRN: [id number] DOB: 12/25/1935 Today's Date: 01/22/2021    History of Present Illness Per MD notes, pt is a 85 y.o. female admitted on 01/17/2021 with pneumonia and UTI. PMH includes  hypertension, atrial fibrillation not on anticoagulants, dementia, PVC, hard of hearing, and syncope.    PT Comments     Pt was asleep in long sitting upon arriving. She easily awakes and agrees to ambulation. Resting HR 112 however quickly elevated to > 150s during OOB activity. HR peaked a 172 BPM and was quickly returned to bed with prolonged rest to decrease to less than 130s. RN aware. Pt did demonstrate much improved overall safety with mobility. No physical assistance required to exit/ return to bed. She ambulated with RW without LOB or major safety concern. Cognition/safety awareness most concerning for falls. Recommend DC back to AL if assisted living able to provide supervision throughout the day.    Follow Up Recommendations  Supervision/Assistance - 24 hour;Supervision for mobility/OOB;Other (comment) (pt will require 24/7 supervision with mobility for safety)     Equipment Recommendations  None recommended by PT       Precautions / Restrictions Precautions Precautions: Fall Restrictions Weight Bearing Restrictions: No    Mobility  Bed Mobility Overal bed mobility: Needs Assistance Bed Mobility: Supine to Sit;Sit to Supine     Supine to sit: Supervision Sit to supine: Supervision   General bed mobility comments: pt was able to exit /enter bed without physical assistance    Transfers Overall transfer level: Needs assistance Equipment used: Rolling walker (2 wheeled) Transfers: Sit to/from Stand Sit to Stand: Min guard         General transfer comment: CGA for safety. no lifting assistance  Ambulation/Gait Ambulation/Gait assistance: Min guard Gait Distance (Feet): 75 Feet Assistive device: Rolling walker (2  wheeled) Gait Pattern/deviations: Step-through pattern Gait velocity: WNL   General Gait Details: Assistance with turning walker to avoid obstacles and verbal cues to keep walker close. Gait distances limited by HR elevation. 108 bpm at rest that quickly raised to > 145 bpm with minimal activity      Balance Overall balance assessment: Needs assistance Sitting-balance support: Feet supported;Bilateral upper extremity supported Sitting balance-Leahy Scale: Good Sitting balance - Comments: no LOB in sitting   Standing balance support: Bilateral upper extremity supported Standing balance-Leahy Scale: Fair         Cognition Arousal/Alertness: Lethargic Behavior During Therapy: WFL for tasks assessed/performed Overall Cognitive Status: History of cognitive impairments - at baseline            General Comments: pt was asleep but easily awakes. She is confused but was able to follow simple one step commands with increased time to process and repetition             Pertinent Vitals/Pain Pain Assessment: No/denies pain     PT Goals (current goals can now be found in the care plan section) Acute Rehab PT Goals Patient Stated Goal: none stated Progress towards PT goals: Progressing toward goals    Frequency    Min 2X/week      PT Plan Other (comment);Discharge plan needs to be updated (Pt did progress with mobility however HR becomes extremely tachy. Recommend close monitoring at all times. DC back to ALF with HH servces)       AM-PAC PT "6 Clicks" Mobility   Outcome Measure  Help needed turning from your back to your side while in a flat  bed without using bedrails?: A Little Help needed moving from lying on your back to sitting on the side of a flat bed without using bedrails?: A Little Help needed moving to and from a bed to a chair (including a wheelchair)?: A Little Help needed standing up from a chair using your arms (e.g., wheelchair or bedside chair)?: A  Little Help needed to walk in hospital room?: A Little Help needed climbing 3-5 steps with a railing? : A Little 6 Click Score: 18    End of Session Equipment Utilized During Treatment: Gait belt Activity Tolerance: Other (comment) (Limited by cardiac response to activity) Patient left: in bed;with call bell/phone within reach Nurse Communication: Mobility status PT Visit Diagnosis: Muscle weakness (generalized) (M62.81);Difficulty in walking, not elsewhere classified (R26.2);Unsteadiness on feet (R26.81)     Time: 1550-1620 PT Time Calculation (min) (ACUTE ONLY): 30 min  Charges:  $Gait Training: 8-22 mins $Therapeutic Activity: 8-22 mins                     Julaine Fusi PTA 01/22/21, 4:34 PM

## 2021-01-22 NOTE — Progress Notes (Signed)
Pts HR 130s-140s at rest / pt asymptomatic / MD made aware/ new med ordered/ will monitor

## 2021-01-22 NOTE — Care Management Important Message (Signed)
Important Message  Patient Details  Name: Amy Stanley MRN: [id number] Date of Birth: 1936-07-28   Medicare Important Message Given:  Yes  Reviewed Medicare IM with son, Enrica Corliss at [phone].  Copy of Medicare IM left in patient's room for reference.     Dannette Barbara 01/22/2021, 11:55 AM

## 2021-01-22 NOTE — Progress Notes (Addendum)
SLP Cancellation Note  Patient Details Name: Amy Stanley MRN: [id number] DOB: 03/02/1936   Cancelled treatment:       Reason Eval/Treat Not Completed: SLP screened, no needs identified, will sign off (chart reviewed; consulted NSG then met w/ pt and family in room). Pt is much improved today -- fully alert/awake and eating meal this AM when seen by MD. Pt was initially agitated and was given medications to aid calming of behaviors. Family is present; pt engages w/ them. Confusion. Appears to be at her baseline per MD/family Pt has a Baseline of Dementia; admitted w/ UTI(3rd recent per family). Pt resides at [facility]. Pt awake, verbal but easily distracted. She denied any difficulty swallowing and stated she ate "fine" at the Lunch meal - a regular diet. She tolerates swallowing pills w/ NSG. Pt conversed answering few basic questions. Pt and family denied any speech-language deficits stating she seems at her baseline now and tolerating her oral diet.   No further skilled ST services indicated as pt appears at her baseline. Recommend general aspiration precautions including reducing distractions at meals d/t Baseline Cognitive decline. Pt agreed. NSG to reconsult if any change in status during admit.       Orinda Kenner, MS, CCC-SLP Speech Language Pathologist Rehab Services [phone] [facility] 01/22/2021, 3:17 PM

## 2021-01-23 NOTE — Progress Notes (Signed)
Palliative:  HPI: 85 y.o. female  with past medical history of dementia, hypertension, atrial fibrillation NOT on anticoagulation, hyperlipidemia, syncope, hard of hearing, depression admitted on 01/17/2021 with passed out in bathroom per son with complaints of syncope, increased urinary frequency, cough, mild shortness of breath and found to have severe sepsis with UTI. Concerns with ongoing decline in mental status and potential aspiration risk. Noted that she is followed by outpatient palliative care via [facility].   Amy Stanley is sleeping soundly at time of my visit. RN reports that she has been alert and without distress and eating well today and no concerns. She remains appropriate for rehab attempt and transition to [facility] if able. I called and spoke with son, Amy Stanley, and he picked up MOST form I left for him. We talked through form so he can complete and bring with him tomorrow. We discussed expectations for future and Amy Stanley's main concern if ensuring that they are not doing more than Amy Stanley would want to prolong her life (they struggle with continuing some of her medications). We discussed that it could be appropriate to continue medications as long as they are not causing side effects, she is eating well, awake enough to take, and able to swallow and if any of these situations change then I would stop all medications and only provide medications to ensure comfort. Amy Stanley agrees. We also discussed that decisions on MOST may change as her condition changes.   All questions/concerns addressed. Emotional support provided.   Exam: Sleeping peacefully. No distress. Breathing regular, unlabored. Abd flat.   Plan: - SNF rehab with transition to [facility]. Continue outpatient palliative services.  - Working on completing MOST form.   15 min  Amy Sill, NP Palliative Medicine Team Pager [phone] (Please see [email or web address] for schedule) Team Phone [phone]    Greater than 50%  of this  time was spent counseling and coordinating care related to the above assessment and plan

## 2021-01-23 NOTE — TOC Progression Note (Addendum)
Transition of Care [facility]) - Progression Note    Patient Details  Name: Amy Stanley MRN: [id number] Date of Birth: [birth year]  Transition of Care [facility]) CM/SW Davis, Bricelyn Phone Number: 01/23/2021, 9:05 AM  Clinical Narrative:     CSW received call from [place], patient's son who reports they choose [facility]. Insurance auth with [employer] has been started, pending auth at this time.   [facility] able to accept patient tomorrow pending insurance auth acceptance.   Expected Discharge Plan: Skilled Nursing Facility Barriers to Discharge: Continued Medical Work up  Expected Discharge Plan and Services Expected Discharge Plan: [place] Choice: [place] arrangements for the past 2 months: Single Family Home (was in process of moving into [facility])                                       Social Determinants of Health (SDOH) Interventions    Readmission Risk Interventions No flowsheet data found.

## 2021-01-23 NOTE — Progress Notes (Signed)
PROGRESS NOTE    [name]  TIB:[id number] DOB: May 18, 1936 DOA: 01/17/2021 PCP: Gracelyn Nurse, MD   Brief Narrative: Taken from H&P.  Amy Stanley is a 85 y.o. female with medical history significant of hypertension, hyperlipidemia, depression, atrial fibrillation not on anticoagulants, dementia, PVC, hard of hearing, syncope, who presents with syncope, increased urinary frequency, cough, mild shortness breath.   Per her son, patient passed out after using bathroom at home. Pt was helped down without fall or any injury.  Per her son, in the past 1.5 days, patient has been having coughing and little shortness of breath, no chest pain, fever or chills.  On arrival patient was hypotensive and responded well to IV fluid, found to be in A. fib with RVR and heart rate initially improved with IV fluid.  Leukocytosis, lactic acidosis, negative COVID PCR, UA with mild leukocytosis and rare bacteria, BNP 307, chest x-ray concerning for right lower lobe infiltrate. Patient was admitted for And started on ceftriaxone and Zithromax.  Did receive 1 dose of vancomycin, cefepime and Flagyl in the ED per sepsis protocol.  Medically seems stable, has advanced dementia and completely dependent for ADLs.  Her assisted living facility will not be able to provide 24/7 care.  TOC is looking for options.  Apparently family was in the process of moving her to an assisted living facility when she developed worsening of shortness of breath requiring hospitalization For CAP. PT is recommending SNF placement.might need long-term care  Palliative care was consulted at son's request.  6/22-confused, no overnight issues  Subjective: Per nsg confusion at baseline. No issues.   Assessment & Plan:   Principal Problem:   CAP (community acquired pneumonia) Active Problems:   Atrial fibrillation with rapid ventricular response (HCC)   Acute lower UTI   Syncope and collapse   Dementia (HCC)   Hypertension    Diarrhea   Severe sepsis (HCC)   Elevated troponin   AKI (acute kidney injury) (HCC)  Severe sepsis secondary to CAP.  Met severe sepsis criteria with leukocytosis, tachycardia, tachypnea, lactic acidosis and hypotension. Blood pressure improved with IV fluid.  Lactic acidosis resolved.  Blood and urine cultures remain negative.  Procalcitonin elevated at 1.68.  No hypoxia. Some concern of aspiration per family.  Ceftriaxone was switched with Unasyn 6/22-swallowing evaluation done.  See speech report Unasyn-complete 5-day course Zithromax to complete a 3-day course Currently on Augmentin. Continue supportive care    Altered mental status.  Resolved.  Currently at baseline CT head was obtained which was without any acute abnormality.  ABG with very mild hypoxia and CO2 of 33.  No other significant abnormalities on rest of the labs.  Most likely secondary to taking Seroquel and trazodone overnight. 6/22 continue to monitor Seroquel and trazodone doses were decreased Per previous notes on stated patient was taking trazodone for very long time and does not wanted to stop    A. fib with RVR.   Heart rate variable overall appears to be improved  Not on any anticoagulation at home  Continue Cardizem     Syncope and collapse.  Unclear etiology can be vasovagal as occurred after using the bathroom.  Hypotension can be contributory, as well as A. fib with RVR.  No focal neurologic deficit. 6/22 PT OT recommended SNF  Sundowning.  Patient is becoming agitated overnight, which is very common in elderly especially with history of advanced dementia. -Delirium precautions -6/22 continue Seroquel at a lower dose as needed  Dementia ([place]): -Namenda   Hypertension.   -Continue home Cardizem -Continue home lisinopril  Diarrhea.  There was some history of intermittent diarrhea.  Patient did not had any bowel movement since in the hospital. -No need to check GI pathogen or C.  Difficile  Elevated troponin.  Peaked at 148 and now trending down.  Most likely secondary to demand ischemia.  No chest pain.  Repeat troponin at 17 which was done due to altered mental status this morning.  AKI.  Resolved.  Objective: Vitals:   01/23/21 0446 01/23/21 0728 01/23/21 1132 01/23/21 1551  BP:  (!) 135/97 132/86 138/86  Pulse:  (!) 103 90 97  Resp:  20    Temp: 98.8 F (37.1 C) (!) 97.5 F (36.4 C) (!) 97.3 F (36.3 C) (!) 97.4 F (36.3 C)  TempSrc:  Oral Oral Oral  SpO2:  96% 98% 99%  Weight:      Height:        Intake/Output Summary (Last 24 hours) at 01/23/2021 1734 Last data filed at 01/23/2021 1355 Gross per 24 hour  Intake 600 ml  Output 0 ml  Net 600 ml   Filed Weights   01/17/21 1047 01/18/21 1327  Weight: 60 kg 59.4 kg    Examination: Pleasant, NAD CTA no wheeze rales rhonchi's Regular irregular no gallops S1-S2 Soft benign positive bowel sounds No edema Awake and alert, baseline confusion  DVT prophylaxis: Lovenox Code Status: DNR Family Communication: None at bedside disposition Plan:  Status is: Inpatient  Remains inpatient appropriate because:Inpatient level of care appropriate due to severity of illness  Dispo: The patient is from: Home              Anticipated d/c is to: To be determined              Patient currently is medically stable, unsafe discharge planning.   Difficult to place patient No              Level of care: Progressive Cardiac    Consultants:  None  Procedures:  Antimicrobials:  Unasyn Zithromax  Data Reviewed: I have personally reviewed following labs and imaging studies  CBC: Recent Labs  Lab 01/17/21 1054 01/18/21 0623 01/19/21 0504 01/21/21 1104  WBC 17.7* 14.1* 10.8* 7.2  NEUTROABS 16.7*  --   --   --   HGB 14.6 12.6 13.7 16.7*  HCT 43.5 37.3 40.8 49.3*  MCV 96.5 96.4 96.0 93.2  PLT 213 207 252 024   Basic Metabolic Panel: Recent Labs  Lab 01/17/21 1054 01/18/21 0623 01/19/21 0504  01/22/21 0519  NA 137 140 139 142  K 4.3 3.8 4.0 3.6  CL 111 112* 109 110  CO2 18* $Remov'24 26 26  'KCOMBH$ GLUCOSE 158* 97 99 96  BUN 31* 25* 20 25*  CREATININE 1.21* 0.69 0.67 0.76  CALCIUM 9.4 9.0 9.1 9.4  MG 2.1  --   --   --    GFR: Estimated Creatinine Clearance: 44.1 mL/min (by C-G formula based on SCr of 0.76 mg/dL). Liver Function Tests: No results for input(s): AST, ALT, ALKPHOS, BILITOT, PROT, ALBUMIN in the last 168 hours. No results for input(s): LIPASE, AMYLASE in the last 168 hours. No results for input(s): AMMONIA in the last 168 hours. Coagulation Profile: No results for input(s): INR, PROTIME in the last 168 hours. Cardiac Enzymes: No results for input(s): CKTOTAL, CKMB, CKMBINDEX, TROPONINI in the last 168 hours. BNP (last 3 results) No results for input(s): PROBNP in  the last 8760 hours. HbA1C: No results for input(s): HGBA1C in the last 72 hours.  CBG: Recent Labs  Lab 01/21/21 1220  GLUCAP 96   Lipid Profile: No results for input(s): CHOL, HDL, LDLCALC, TRIG, CHOLHDL, LDLDIRECT in the last 72 hours.  Thyroid Function Tests: No results for input(s): TSH, T4TOTAL, FREET4, T3FREE, THYROIDAB in the last 72 hours. Anemia Panel: No results for input(s): VITAMINB12, FOLATE, FERRITIN, TIBC, IRON, RETICCTPCT in the last 72 hours. Sepsis Labs: Recent Labs  Lab 01/17/21 1054 01/17/21 1257 01/18/21 1331  PROCALCITON  --  1.68  --   LATICACIDVEN 3.3* 2.7* 1.0    Recent Results (from the past 240 hour(s))  Resp Panel by RT-PCR (Flu A&B, Covid) Nasopharyngeal Swab     Status: None   Collection Time: 01/17/21 10:54 AM   Specimen: Nasopharyngeal Swab; Nasopharyngeal(NP) swabs in vial transport medium  Result Value Ref Range Status   SARS Coronavirus 2 by RT PCR NEGATIVE NEGATIVE Final    Comment: (NOTE) SARS-CoV-2 target nucleic acids are NOT DETECTED.  The SARS-CoV-2 RNA is generally detectable in upper respiratory specimens during the acute phase of infection. The  lowest concentration of SARS-CoV-2 viral copies this assay can detect is 138 copies/mL. A negative result does not preclude SARS-Cov-2 infection and should not be used as the sole basis for treatment or other patient management decisions. A negative result may occur with  improper specimen collection/handling, submission of specimen other than nasopharyngeal swab, presence of viral mutation(s) within the areas targeted by this assay, and inadequate number of viral copies(<138 copies/mL). A negative result must be combined with clinical observations, patient history, and epidemiological information. The expected result is Negative.  Fact Sheet for Patients:  BloggerCourse.com  Fact Sheet for Healthcare Providers:  SeriousBroker.it  This test is no t yet approved or cleared by the [place] FDA and  has been authorized for detection and/or diagnosis of SARS-CoV-2 by FDA under an Emergency Use Authorization (EUA). This EUA will remain  in effect (meaning this test can be used) for the duration of the COVID-19 declaration under Section 564(b)(1) of the Act, 21 U.S.C.section 360bbb-3(b)(1), unless the authorization is terminated  or revoked sooner.       Influenza A by PCR NEGATIVE NEGATIVE Final   Influenza B by PCR NEGATIVE NEGATIVE Final    Comment: (NOTE) The Xpert Xpress SARS-CoV-2/FLU/RSV plus assay is intended as an aid in the diagnosis of influenza from Nasopharyngeal swab specimens and should not be used as a sole basis for treatment. Nasal washings and aspirates are unacceptable for Xpert Xpress SARS-CoV-2/FLU/RSV testing.  Fact Sheet for Patients: BloggerCourse.com  Fact Sheet for Healthcare Providers: SeriousBroker.it  This test is not yet approved or cleared by the [place] FDA and has been authorized for detection and/or diagnosis of SARS-CoV-2 by FDA under  an Emergency Use Authorization (EUA). This EUA will remain in effect (meaning this test can be used) for the duration of the COVID-19 declaration under Section 564(b)(1) of the Act, 21 U.S.C. section 360bbb-3(b)(1), unless the authorization is terminated or revoked.  Performed at [facility], [address] [place], [place] [address]   Culture, blood (routine x 2)     Status: None   Collection Time: 01/17/21 11:15 AM   Specimen: BLOOD  Result Value Ref Range Status   Specimen Description BLOOD LEFT FA  Final   Special Requests   Final    BOTTLES DRAWN AEROBIC AND ANAEROBIC Blood Culture adequate volume  Culture   Final    NO GROWTH 5 DAYS Performed at [facility], [address] [place], [place] [address]    Report Status 01/22/2021 FINAL  Final  Culture, blood (routine x 2)     Status: None   Collection Time: 01/17/21 11:20 AM   Specimen: BLOOD  Result Value Ref Range Status   Specimen Description BLOOD LEFT HAND  Final   Special Requests   Final    BOTTLES DRAWN AEROBIC AND ANAEROBIC Blood Culture adequate volume   Culture   Final    NO GROWTH 5 DAYS Performed at [facility], [address], [place], [place] [address]    Report Status 01/22/2021 FINAL  Final  Urine culture     Status: None   Collection Time: 01/17/21 11:45 AM   Specimen: Urine, Random  Result Value Ref Range Status   Specimen Description   Final    URINE, RANDOM Performed at [facility], [address], [place], [place] [address]    Special Requests   Final    NONE Performed at [facility], [address], [place], [place] [address]    Culture   Final    NO GROWTH Performed at [facility], [place] [address], [place], [place] [address]    Report Status 01/18/2021 FINAL  Final      Radiology Studies: No results found.  Scheduled Meds:  amoxicillin-clavulanate  1 tablet Oral Q12H   aspirin EC  81 mg Oral Daily   diltiazem  180 mg  Oral Daily   enoxaparin (LOVENOX) injection  40 mg Subcutaneous Q24H   escitalopram  10 mg Oral Daily   lisinopril  5 mg Oral Daily   memantine  5 mg Oral BID   metoprolol tartrate  12.5 mg Oral BID   oxybutynin  5 mg Oral BID   QUEtiapine  12.5 mg Oral QHS   traZODone  25 mg Oral QHS   Continuous Infusions:  sodium chloride Stopped (01/21/21 1551)   lactated ringers Stopped (01/22/21 0300)     LOS: 6 days  35 minutes with more than 50% on [place], MD [facility]  If 7PM-7AM, please contact night-coverage [email or web address]  01/23/2021, 5:34 PM

## 2021-01-23 NOTE — Plan of Care (Signed)

## 2021-01-24 DIAGNOSIS — N390 Urinary tract infection, site not specified: Secondary | ICD-10-CM | POA: Diagnosis not present

## 2021-01-24 DIAGNOSIS — F32A Depression, unspecified: Secondary | ICD-10-CM | POA: Diagnosis not present

## 2021-01-24 DIAGNOSIS — E785 Hyperlipidemia, unspecified: Secondary | ICD-10-CM | POA: Diagnosis not present

## 2021-01-24 DIAGNOSIS — G9340 Encephalopathy, unspecified: Secondary | ICD-10-CM | POA: Diagnosis not present

## 2021-01-24 DIAGNOSIS — R1310 Dysphagia, unspecified: Secondary | ICD-10-CM | POA: Diagnosis not present

## 2021-01-24 DIAGNOSIS — G309 Alzheimer's disease, unspecified: Secondary | ICD-10-CM | POA: Diagnosis not present

## 2021-01-24 DIAGNOSIS — R2681 Unsteadiness on feet: Secondary | ICD-10-CM | POA: Diagnosis not present

## 2021-01-24 DIAGNOSIS — J189 Pneumonia, unspecified organism: Secondary | ICD-10-CM | POA: Diagnosis not present

## 2021-01-24 DIAGNOSIS — R41841 Cognitive communication deficit: Secondary | ICD-10-CM | POA: Diagnosis not present

## 2021-01-24 DIAGNOSIS — N179 Acute kidney failure, unspecified: Secondary | ICD-10-CM | POA: Diagnosis not present

## 2021-01-24 DIAGNOSIS — A419 Sepsis, unspecified organism: Secondary | ICD-10-CM | POA: Diagnosis not present

## 2021-01-24 DIAGNOSIS — R55 Syncope and collapse: Secondary | ICD-10-CM | POA: Diagnosis not present

## 2021-01-24 DIAGNOSIS — I10 Essential (primary) hypertension: Secondary | ICD-10-CM | POA: Diagnosis not present

## 2021-01-24 DIAGNOSIS — F0280 Dementia in other diseases classified elsewhere without behavioral disturbance: Secondary | ICD-10-CM | POA: Diagnosis not present

## 2021-01-24 DIAGNOSIS — M6281 Muscle weakness (generalized): Secondary | ICD-10-CM | POA: Diagnosis not present

## 2021-01-24 DIAGNOSIS — I4891 Unspecified atrial fibrillation: Secondary | ICD-10-CM | POA: Diagnosis not present

## 2021-01-24 LAB — CBC
HCT: 43.4 % (ref 36.0–46.0)
Hemoglobin: 14.4 g/dL (ref 12.0–15.0)
MCH: 31.9 pg (ref 26.0–34.0)
MCHC: 33.2 g/dL (ref 30.0–36.0)
MCV: 96 fL (ref 80.0–100.0)
Platelets: 263 10*3/uL (ref 150–400)
RBC: 4.52 MIL/uL (ref 3.87–5.11)
RDW: 12.9 % (ref 11.5–15.5)
WBC: 5.4 10*3/uL (ref 4.0–10.5)
nRBC: 0 % (ref 0.0–0.2)

## 2021-01-24 MED ORDER — QUETIAPINE FUMARATE 25 MG PO TABS: 12.5000 mg | ORAL_TABLET | Freq: Every day | ORAL | Status: AC

## 2021-01-24 MED ORDER — TRAZODONE HCL 50 MG PO TABS: 25.0000 mg | ORAL_TABLET | Freq: Every day | ORAL | Status: AC

## 2021-01-24 MED ORDER — ASPIRIN 81 MG PO TBEC: 81.0000 mg | DELAYED_RELEASE_TABLET | Freq: Every day | ORAL | 11 refills | Status: AC

## 2021-01-24 MED ORDER — METOPROLOL TARTRATE 25 MG PO TABS: 12.5000 mg | ORAL_TABLET | Freq: Two times a day (BID) | ORAL | Status: AC

## 2021-01-24 NOTE — TOC Transition Note (Addendum)
Transition of Care [facility]) - CM/SW Discharge Note   Patient Details  Name: Amy Stanley MRN: [id number] Date of Birth: 12-Sep-1935  [facility] CM/SW Contact:  Alberteen Sam, LCSW Phone Number: 01/24/2021, 10:49 AM   Clinical Narrative:     Patient will DC [id number] Anticipated DC date: 01/24/21 Family notified:son Amy Stanley Transport by: family  Per MD patient ready for DC to [facility] . RN, patient, patient's family, and facility notified of DC. Discharge Summary sent to facility. RN given number for report  [phone] Room 217. DC packet on chart. Patient son Amy Stanley or his sister to transport around 3:00 pm. CSW signing off.  Pricilla Riffle, LCSW   Final next level of care: Skilled Nursing Facility Barriers to Discharge: No Barriers Identified   Patient Goals and CMS Choice Patient states their goals for this hospitalization and ongoing recovery are:: to go to SNF CMS Medicare.gov Compare Post Acute Care list provided to:: Patient Represenative (must comment) (son Amy Stanley) Choice offered to / list presented to : Adult Children  Discharge Placement              Patient chooses bed at: [facility] Patient to be transferred to facility by: TBD Name of family member notified: son Amy Stanley Patient and family notified of of transfer: 01/24/21  Discharge Plan and Services     Post Acute Care Choice: [place]                               Social Determinants of Health (SDOH) Interventions     Readmission Risk Interventions No flowsheet data found.

## 2021-01-24 NOTE — Discharge Summary (Signed)
JUDA LAJEUNESSE YTK:[id number] DOB: 30-Jul-1936 DOA: 01/17/2021  PCP: Baxter Hire, MD  Admit date: 01/17/2021 Discharge date: 01/24/2021  Admitted From: home Disposition:  SNF  Recommendations for Outpatient Follow-up:  Follow up with PCP in 1 week Please obtain BMP/CBC in one wee     Discharge Condition:Stable CODE STATUS:DNR  Diet recommendation: Heart Healthy  Brief/Interim Summary: Per HPI: DEYRA PERDOMO is a 85 y.o. female with medical history significant of hypertension, hyperlipidemia, depression, atrial fibrillation not on anticoagulants, dementia, PVC, hard of hearing, syncope, who presents with syncope, increased urinary frequency, cough, mild shortness breath.Per her son, patient passed out after using bathroom at home. Pt was helped down without fall or any injury.  Per her son, patient was coughing and  had little shortness of breath.Patient also had intermittent diarrhea, no nausea, vomiting or abdominal pain.  Per EMS report, patient had hypotension with SBP 70s, 300 cc normal saline were given by EMS and additional 2 L of LR were given in ED.  Blood pressure improved to 114/71.  Patient has A. fib with RVR, initial heart rate was up to 180s, which improved to 80-100s in ED. Covid PCR negative. Patient was admitted .  Patient is medically stable for discharge. Has advanced dementia and completely dependent for ADLs. She is at her baseline.  Severe sepsis secondary to CAP.   Lactic acidosis resolved.  Hemodynamics improved Completed antibiotic course.  On RA.        Altered mental status.  Resolved.  Currently at baseline CT head was obtained which was without any acute abnormality.  ABG with very mild hypoxia and CO2 of 33.  No other significant abnormalities on rest of the labs.  Most likely secondary to taking Seroquel and trazodone , the doses of both meds were decreased and tolerated better.       A. fib with RVR.   HR overall improved Not on any  anticoagulation at home Continue Cardizem , ASA, and beta blockers       Syncope and collapse.  Unclear etiology can be vasovagal as occurred after using the bathroom.  Hypotension can be contributory, as well as A. fib with RVR.  No focal neurologic deficit.    Sundowning.  Patient is becoming agitated overnight, which is very common in elderly especially with history of advanced dementia. -Delirium precautions  continue Seroquel     Dementia ([place]): -Namenda   Hypertension.   Continue beta blk, cardizem, and ACEI   Diarrhea.  There was some history of intermittent diarrhea.   Patient did not have any diarrhea since hospitalized Patient did not had any bowel movement since in the hospital. Encourage po intake.   Elevated troponin.  likely secondary to demand ischemia.  Trended down.     AKI.  Resolved.     Discharge Diagnoses:  Principal Problem:   CAP (community acquired pneumonia) Active Problems:   Atrial fibrillation with rapid ventricular response (HCC)   Acute lower UTI   Syncope and collapse   Dementia (HCC)   Hypertension   Diarrhea   Severe sepsis (HCC)   Elevated troponin   AKI (acute kidney injury) [facility])    Discharge Instructions  Discharge Instructions     Call MD for:  temperature >100.4   Complete by: As directed    Diet - low sodium heart healthy   Complete by: As directed    Increase activity slowly   Complete by: As directed       Allergies  as of 01/24/2021   No Known Allergies      Medication List     STOP taking these medications    cyanocobalamin 1000 MCG/ML injection Commonly known as: (VITAMIN B-12)       TAKE these medications    aspirin 81 MG EC tablet Take 1 tablet (81 mg total) by mouth daily. Swallow whole.   diltiazem 180 MG 24 hr capsule Commonly known as: CARDIZEM CD Take 1 capsule (180 mg total) by mouth daily.   escitalopram 10 MG tablet Commonly known as: LEXAPRO Take 10 mg by mouth daily.    lisinopril 5 MG tablet Commonly known as: ZESTRIL Take 5 mg by mouth daily.   memantine 5 MG tablet Commonly known as: NAMENDA Take 1 tablet by mouth 2 (two) times daily.   metoprolol tartrate 25 MG tablet Commonly known as: LOPRESSOR Take 0.5 tablets (12.5 mg total) by mouth 2 (two) times daily.   oxybutynin 5 MG tablet Commonly known as: DITROPAN Take 5 mg by mouth 2 (two) times daily.   QUEtiapine 25 MG tablet Commonly known as: SEROQUEL Take 0.5 tablets (12.5 mg total) by mouth at bedtime.   traZODone 50 MG tablet Commonly known as: DESYREL Take 0.5 tablets (25 mg total) by mouth at bedtime. What changed:  how much to take when to take this        Follow-up Information     Baxter Hire, MD Follow up in 1 week(s).   Specialty: Internal Medicine Contact information: [place] [address] [phone]                No Known Allergies  Consultations:    Procedures/Studies: CT HEAD WO CONTRAST  Result Date: 01/21/2021 CLINICAL DATA:  Inpatient. Mental status change. Sedation. Difficult to arouse. No reported injury. EXAM: CT HEAD WITHOUT CONTRAST TECHNIQUE: Contiguous axial images were obtained from the base of the skull through the vertex without intravenous contrast. COMPARISON:  None. FINDINGS: Brain: Generalized mild cerebral volume loss. Nonspecific mild subcortical and periventricular white matter hypodensity, most in keeping with chronic small vessel ischemic change. No evidence of parenchymal hemorrhage or extra-axial fluid collection. No mass lesion, mass effect, or midline shift. No CT evidence of acute infarction. Cerebral volume is age appropriate. No ventriculomegaly. Vascular: No acute abnormality. Skull: No evidence of calvarial fracture. Sinuses/Orbits: The visualized paranasal sinuses are essentially clear. Other:  The mastoid air cells are unopacified. IMPRESSION: 1. No evidence of acute intracranial abnormality. 2.  Generalized mild cerebral volume loss and mild chronic small vessel ischemic changes in the cerebral white matter. Electronically Signed   By: Ilona Sorrel M.D.   On: 01/21/2021 12:15   DG Chest Portable 1 View  Result Date: 01/17/2021 CLINICAL DATA:  Shortness of breath, syncope, diarrhea, heaviness in chest, history hypertension EXAM: PORTABLE CHEST 1 VIEW COMPARISON:  Portable exam 1117 hours compared to 10/07/2020 FINDINGS: Upper normal size of cardiac silhouette. Mediastinal contours and pulmonary vascularity normal. Atherosclerotic calcification aorta. RIGHT lower lobe infiltrate consistent with pneumonia. Remaining lungs clear. No pleural effusion or pneumothorax. Bones demineralized. IMPRESSION: RIGHT lower lobe infiltrate consistent with pneumonia. Electronically Signed   By: Lavonia Dana M.D.   On: 01/17/2021 11:55      Subjective: Has not complaints. Baseline MS  Discharge Exam: Vitals:   01/24/21 0353 01/24/21 0739  BP: 128/83 126/85  Pulse: 74 85  Resp: 17 20  Temp: 97.8 F (36.6 C) 98.1 F (36.7 C)  SpO2: 99% 96%  Vitals:   01/23/21 1851 01/24/21 0353 01/24/21 0622 01/24/21 0739  BP: 127/80 128/83  126/85  Pulse: (!) 101 74  85  Resp:  17  20  Temp: 97.7 F (36.5 C) 97.8 F (36.6 C)  98.1 F (36.7 C)  TempSrc: Axillary     SpO2: 97% 99%  96%  Weight:   56.1 kg   Height:        General: Pt is alert, awake, not in acute distress, at baseline MS Cardiovascular: RRR, S1/S2 +, no rubs, no gallops Respiratory: CTA bilaterally, no wheezing, no rhonchi Abdominal: Soft, NT, ND, bowel sounds + Extremities: no edema    The results of significant diagnostics from this hospitalization (including imaging, microbiology, ancillary and laboratory) are listed below for reference.     Microbiology: Recent Results (from the past 240 hour(s))  Resp Panel by RT-PCR (Flu A&B, Covid) Nasopharyngeal Swab     Status: None   Collection Time: 01/17/21 10:54 AM   Specimen:  Nasopharyngeal Swab; Nasopharyngeal(NP) swabs in vial transport medium  Result Value Ref Range Status   SARS Coronavirus 2 by RT PCR NEGATIVE NEGATIVE Final    Comment: (NOTE) SARS-CoV-2 target nucleic acids are NOT DETECTED.  The SARS-CoV-2 RNA is generally detectable in upper respiratory specimens during the acute phase of infection. The lowest concentration of SARS-CoV-2 viral copies this assay can detect is 138 copies/mL. A negative result does not preclude SARS-Cov-2 infection and should not be used as the sole basis for treatment or other patient management decisions. A negative result may occur with  improper specimen collection/handling, submission of specimen other than nasopharyngeal swab, presence of viral mutation(s) within the areas targeted by this assay, and inadequate number of viral copies(<138 copies/mL). A negative result must be combined with clinical observations, patient history, and epidemiological information. The expected result is Negative.  Fact Sheet for Patients:  EntrepreneurPulse.com.au  Fact Sheet for Healthcare Providers:  IncredibleEmployment.be  This test is no t yet approved or cleared by the [place] FDA and  has been authorized for detection and/or diagnosis of SARS-CoV-2 by FDA under an Emergency Use Authorization (EUA). This EUA will remain  in effect (meaning this test can be used) for the duration of the COVID-19 declaration under Section 564(b)(1) of the Act, 21 U.S.C.section 360bbb-3(b)(1), unless the authorization is terminated  or revoked sooner.       Influenza A by PCR NEGATIVE NEGATIVE Final   Influenza B by PCR NEGATIVE NEGATIVE Final    Comment: (NOTE) The Xpert Xpress SARS-CoV-2/FLU/RSV plus assay is intended as an aid in the diagnosis of influenza from Nasopharyngeal swab specimens and should not be used as a sole basis for treatment. Nasal washings and aspirates are unacceptable for  Xpert Xpress SARS-CoV-2/FLU/RSV testing.  Fact Sheet for Patients: EntrepreneurPulse.com.au  Fact Sheet for Healthcare Providers: IncredibleEmployment.be  This test is not yet approved or cleared by the [place] FDA and has been authorized for detection and/or diagnosis of SARS-CoV-2 by FDA under an Emergency Use Authorization (EUA). This EUA will remain in effect (meaning this test can be used) for the duration of the COVID-19 declaration under Section 564(b)(1) of the Act, 21 U.S.C. section 360bbb-3(b)(1), unless the authorization is terminated or revoked.  Performed at [facility], [address] [place], [place] [address]   Culture, blood (routine x 2)     Status: None   Collection Time: 01/17/21 11:15 AM   Specimen: BLOOD  Result Value Ref Range Status  Specimen Description BLOOD LEFT FA  Final   Special Requests   Final    BOTTLES DRAWN AEROBIC AND ANAEROBIC Blood Culture adequate volume   Culture   Final    NO GROWTH 5 DAYS Performed at [facility], [address] [place], [place] [address]    Report Status 01/22/2021 FINAL  Final  Culture, blood (routine x 2)     Status: None   Collection Time: 01/17/21 11:20 AM   Specimen: BLOOD  Result Value Ref Range Status   Specimen Description BLOOD LEFT HAND  Final   Special Requests   Final    BOTTLES DRAWN AEROBIC AND ANAEROBIC Blood Culture adequate volume   Culture   Final    NO GROWTH 5 DAYS Performed at [facility], [address], [place], [place] [address]    Report Status 01/22/2021 FINAL  Final  Urine culture     Status: None   Collection Time: 01/17/21 11:45 AM   Specimen: Urine, Random  Result Value Ref Range Status   Specimen Description   Final    URINE, RANDOM Performed at [facility], [address], [place], [place] [address]    Special Requests   Final    NONE Performed at [facility], [address], [place], [place] [address]    Culture   Final    NO GROWTH Performed at [facility], [place] [address] [place],  [address]    Report Status 01/18/2021 FINAL  Final     Labs: BNP (last 3 results) Recent Labs    01/17/21 1054  BNP 947.6*   Basic Metabolic Panel: Recent Labs  Lab 01/17/21 1054 01/18/21 0623 01/19/21 0504 01/22/21 0519  NA 137 140 139 142  K 4.3 3.8 4.0 3.6  CL 111 112* 109 110  CO2 18* 24 26 26   GLUCOSE 158* 97 99 96  BUN 31* 25* 20 25*  CREATININE 1.21* 0.69 0.67 0.76  CALCIUM 9.4 9.0 9.1 9.4  MG 2.1  --   --   --    Liver Function Tests: No results for input(s): AST, ALT, ALKPHOS, BILITOT, PROT, ALBUMIN in the last 168 hours. No results for input(s): LIPASE, AMYLASE in the last 168 hours. No results for input(s): AMMONIA in the last 168 hours. CBC: Recent Labs  Lab 01/17/21 1054 01/18/21 0623 01/19/21 0504 01/21/21 1104 01/24/21 0521  WBC 17.7* 14.1* 10.8* 7.2 5.4  NEUTROABS 16.7*  --   --   --   --   HGB 14.6 12.6 13.7 16.7* 14.4  HCT 43.5 37.3 40.8 49.3* 43.4  MCV 96.5 96.4 96.0 93.2 96.0  PLT 213 207 252 271 263   Cardiac Enzymes: No results for input(s): CKTOTAL, CKMB, CKMBINDEX, TROPONINI in the last 168 hours. BNP: Invalid input(s): POCBNP CBG: Recent Labs  Lab 01/21/21 1220  GLUCAP 96   D-Dimer No results for input(s): DDIMER in the last 72 hours. Hgb A1c No results for input(s): HGBA1C in the last 72 hours. Lipid Profile No results for input(s): CHOL, HDL, LDLCALC, TRIG, CHOLHDL, LDLDIRECT in the last 72 hours. Thyroid function studies No results for input(s): TSH, T4TOTAL, T3FREE, THYROIDAB in the last 72 hours.  Invalid input(s): FREET3 Anemia work up No results for input(s): VITAMINB12, FOLATE, FERRITIN, TIBC, IRON, RETICCTPCT in the last 72 hours. Urinalysis    Component Value Date/Time   COLORURINE AMBER (A) 01/17/2021 1145   APPEARANCEUR HAZY (A) 01/17/2021 1145   LABSPEC 1.027 01/17/2021  1145    PHURINE 5.0 01/17/2021 1145   [place] 01/17/2021 1145   HGBUR NEGATIVE 01/17/2021 [place] 01/17/2021 1145   KETONESUR 5 (A) 01/17/2021 1145   PROTEINUR 100 (A) 01/17/2021 1145   NITRITE NEGATIVE 01/17/2021 1145   LEUKOCYTESUR TRACE (A) 01/17/2021 1145   Sepsis Labs Invalid input(s): PROCALCITONIN,  WBC,  LACTICIDVEN Microbiology Recent Results (from the past 240 hour(s))  Resp Panel by RT-PCR (Flu A&B, Covid) Nasopharyngeal Swab     Status: None   Collection Time: 01/17/21 10:54 AM   Specimen: Nasopharyngeal Swab; Nasopharyngeal(NP) swabs in vial transport medium  Result Value Ref Range Status   SARS Coronavirus 2 by RT PCR NEGATIVE NEGATIVE Final    Comment: (NOTE) SARS-CoV-2 target nucleic acids are NOT DETECTED.  The SARS-CoV-2 RNA is generally detectable in upper respiratory specimens during the acute phase of infection. The lowest concentration of SARS-CoV-2 viral copies this assay can detect is 138 copies/mL. A negative result does not preclude SARS-Cov-2 infection and should not be used as the sole basis for treatment or other patient management decisions. A negative result may occur with  improper specimen collection/handling, submission of specimen other than nasopharyngeal swab, presence of viral mutation(s) within the areas targeted by this assay, and inadequate number of viral copies(<138 copies/mL). A negative result must be combined with clinical observations, patient history, and epidemiological information. The expected result is Negative.  Fact Sheet for Patients:  EntrepreneurPulse.com.au  Fact Sheet for Healthcare Providers:  IncredibleEmployment.be  This test is no t yet approved or cleared by the [place] FDA and  has been authorized for detection and/or diagnosis of SARS-CoV-2 by FDA under an Emergency Use Authorization (EUA). This EUA will remain  in effect (meaning this test can  be used) for the duration of the COVID-19 declaration under Section 564(b)(1) of the Act, 21 U.S.C.section 360bbb-3(b)(1), unless the authorization is terminated  or revoked sooner.       Influenza A by PCR NEGATIVE NEGATIVE Final   Influenza B by PCR NEGATIVE NEGATIVE Final    Comment: (NOTE) The Xpert Xpress SARS-CoV-2/FLU/RSV plus assay is intended as an aid in the diagnosis of influenza from Nasopharyngeal swab specimens and should not be used as a sole basis for treatment. Nasal washings and aspirates are unacceptable for Xpert Xpress SARS-CoV-2/FLU/RSV testing.  Fact Sheet for Patients: EntrepreneurPulse.com.au  Fact Sheet for Healthcare Providers: IncredibleEmployment.be  This test is not yet approved or cleared by the [place] FDA and has been authorized for detection and/or diagnosis of SARS-CoV-2 by FDA under an Emergency Use Authorization (EUA). This EUA will remain in effect (meaning this test can be used) for the duration of the COVID-19 declaration under Section 564(b)(1) of the Act, 21 U.S.C. section 360bbb-3(b)(1), unless the authorization is terminated or revoked.  Performed at [facility], [address] [place], [place] [address]   Culture, blood (routine x 2)     Status: None   Collection Time: 01/17/21 11:15 AM   Specimen: BLOOD  Result Value Ref Range Status   Specimen Description BLOOD LEFT FA  Final   Special Requests   Final    BOTTLES DRAWN AEROBIC AND ANAEROBIC Blood Culture adequate volume   Culture   Final    NO GROWTH 5 DAYS Performed at [facility], [address], [place], [place] [address]    Report Status 01/22/2021 FINAL  Final  Culture, blood (routine x 2)     Status: None  Collection Time: 01/17/21 11:20 AM   Specimen: BLOOD  Result Value Ref Range Status   Specimen Description BLOOD LEFT HAND  Final   Special Requests   Final    BOTTLES DRAWN AEROBIC AND ANAEROBIC  Blood Culture adequate volume   Culture   Final    NO GROWTH 5 DAYS Performed at [facility], [address], [place], [place] [address]    Report Status 01/22/2021 FINAL  Final  Urine culture     Status: None   Collection Time: 01/17/21 11:45 AM   Specimen: Urine, Random  Result Value Ref Range Status   Specimen Description   Final    URINE, RANDOM Performed at [facility], [address] [place], [place] [address]    Special Requests   Final    NONE Performed at [facility], [address] [place], [place] [address]    Culture   Final    NO GROWTH Performed at [facility], [place] [address] [place], [place] [address]    Report Status 01/18/2021 FINAL  Final     Time coordinating discharge: Over 30 minutes  SIGNED:   Nolberto Hanlon, MD  [facility] 01/24/2021, 10:09 AM Pager   If 7PM-7AM, please contact night-coverage [email or web address] Password [email or web address]

## 2021-01-28 DIAGNOSIS — F0280 Dementia in other diseases classified elsewhere without behavioral disturbance: Secondary | ICD-10-CM | POA: Diagnosis not present

## 2021-01-28 DIAGNOSIS — I4891 Unspecified atrial fibrillation: Secondary | ICD-10-CM | POA: Diagnosis not present

## 2021-01-28 DIAGNOSIS — R55 Syncope and collapse: Secondary | ICD-10-CM | POA: Diagnosis not present

## 2021-01-28 DIAGNOSIS — I10 Essential (primary) hypertension: Secondary | ICD-10-CM | POA: Diagnosis not present

## 2021-01-30 DIAGNOSIS — J189 Pneumonia, unspecified organism: Secondary | ICD-10-CM | POA: Diagnosis not present

## 2021-01-30 DIAGNOSIS — G9340 Encephalopathy, unspecified: Secondary | ICD-10-CM | POA: Diagnosis not present

## 2021-01-30 DIAGNOSIS — I4891 Unspecified atrial fibrillation: Secondary | ICD-10-CM | POA: Diagnosis not present

## 2021-01-30 DIAGNOSIS — A419 Sepsis, unspecified organism: Secondary | ICD-10-CM | POA: Diagnosis not present

## 2021-02-01 DIAGNOSIS — N390 Urinary tract infection, site not specified: Secondary | ICD-10-CM | POA: Diagnosis not present

## 2021-02-01 DIAGNOSIS — I4891 Unspecified atrial fibrillation: Secondary | ICD-10-CM | POA: Diagnosis not present

## 2021-02-01 DIAGNOSIS — E785 Hyperlipidemia, unspecified: Secondary | ICD-10-CM | POA: Diagnosis not present

## 2021-02-01 DIAGNOSIS — F32A Depression, unspecified: Secondary | ICD-10-CM | POA: Diagnosis not present

## 2021-02-01 DIAGNOSIS — I10 Essential (primary) hypertension: Secondary | ICD-10-CM | POA: Diagnosis not present

## 2021-02-01 DIAGNOSIS — F0280 Dementia in other diseases classified elsewhere without behavioral disturbance: Secondary | ICD-10-CM | POA: Diagnosis not present

## 2021-02-06 ENCOUNTER — Encounter: Payer: Self-pay | Admitting: Emergency Medicine

## 2021-02-06 ENCOUNTER — Emergency Department
Admission: EM | Admit: 2021-02-06 | Discharge: 2021-02-07 | Disposition: A | Discharge status: Home / Self Care | Payer: PPO | Attending: Emergency Medicine | Admitting: Emergency Medicine

## 2021-02-06 DIAGNOSIS — M47812 Spondylosis without myelopathy or radiculopathy, cervical region: Secondary | ICD-10-CM | POA: Diagnosis not present

## 2021-02-06 DIAGNOSIS — R5381 Other malaise: Secondary | ICD-10-CM | POA: Diagnosis not present

## 2021-02-06 DIAGNOSIS — Z043 Encounter for examination and observation following other accident: Secondary | ICD-10-CM | POA: Diagnosis not present

## 2021-02-06 DIAGNOSIS — Z79899 Other long term (current) drug therapy: Secondary | ICD-10-CM | POA: Insufficient documentation

## 2021-02-06 DIAGNOSIS — F0390 Unspecified dementia without behavioral disturbance: Secondary | ICD-10-CM | POA: Insufficient documentation

## 2021-02-06 DIAGNOSIS — I10 Essential (primary) hypertension: Secondary | ICD-10-CM | POA: Insufficient documentation

## 2021-02-06 DIAGNOSIS — Z7982 Long term (current) use of aspirin: Secondary | ICD-10-CM | POA: Diagnosis not present

## 2021-02-06 DIAGNOSIS — W19XXXA Unspecified fall, initial encounter: Secondary | ICD-10-CM | POA: Diagnosis not present

## 2021-02-06 DIAGNOSIS — R55 Syncope and collapse: Secondary | ICD-10-CM | POA: Diagnosis not present

## 2021-02-06 DIAGNOSIS — Y92002 Bathroom of unspecified non-institutional (private) residence single-family (private) house as the place of occurrence of the external cause: Secondary | ICD-10-CM | POA: Insufficient documentation

## 2021-02-06 DIAGNOSIS — M4313 Spondylolisthesis, cervicothoracic region: Secondary | ICD-10-CM | POA: Diagnosis not present

## 2021-02-06 DIAGNOSIS — I4891 Unspecified atrial fibrillation: Secondary | ICD-10-CM | POA: Diagnosis not present

## 2021-02-06 DIAGNOSIS — R69 Illness, unspecified: Secondary | ICD-10-CM | POA: Diagnosis not present

## 2021-02-06 DIAGNOSIS — R296 Repeated falls: Secondary | ICD-10-CM

## 2021-02-06 DIAGNOSIS — R220 Localized swelling, mass and lump, head: Secondary | ICD-10-CM | POA: Diagnosis not present

## 2021-02-06 NOTE — ED Triage Notes (Signed)
Pt arrived to ED via [facility] from [facility] memory care post unwitnessed fall. Per staff, pt was seen at 2230 and 20 minutes later found in bathroom with shower on, water pouring over into floor and pt sitting upright on her buttocks. Pt c/o knee pain (unknown which side.) Unknown LOC or if pt hit head. Pt at baseline altered to all but self. EMS reports pts son was called by facility to inform of incident. Unknown blood thinners by staff due to pt new resident. EMS was able to witness pt ambulate with her walker at facility before transport without difficulty or c/o pain.

## 2021-02-06 NOTE — ED Notes (Signed)
CBG:90 

## 2021-02-06 NOTE — ED Provider Notes (Signed)
[facility] Emergency Department Provider Note ____________________________________________   Event Date/Time   First MD Initiated Contact with Patient 02/06/21 2342     (approximate)  I have reviewed the triage vital signs and the nursing notes.   HISTORY  Chief Complaint Fall    HPI Amy Stanley is a 85 y.o. female with history of dementia, atrial fibrillation not on anticoagulation, hypertension, hyperlipidemia who presents to the emergency department with EMS from [facility] [place] facility after she had an unwitnessed fall.  Patient was found in the bathroom without her walker sitting on the floor.  Nursing staff Regino Schultze) reports that initially she was complaining of knee pain but has no complaints at this time.  Was able to ambulate with a walker with EMS.  Nursing staff unable to provide much history as they report patient just arrived to their facility yesterday.  It appears she was transferred from [facility] after she was discharged from the hospital on 01/24/2021.  It appears she was just admitted to the hospital for syncope, sepsis from community-acquired pneumonia, atrial fibrillation with RVR, AKI.  Patient has no complaints at this time.         Past Medical History:  Diagnosis Date   Hypertension     Patient Active Problem List   Diagnosis Date Noted   CAP (community acquired pneumonia) 01/17/2021   Severe sepsis ([place]) 01/17/2021   Elevated troponin 01/17/2021   AKI (acute kidney injury) ([place]) 01/17/2021   Mild concentric left ventricular hypertrophy (LVH) 11/01/2020   Moderate aortic regurgitation 11/01/2020   Atrial fibrillation with rapid ventricular response ([place]) 10/06/2020   Acute lower UTI 10/06/2020   Syncope and collapse 10/06/2020   Dementia ([place]) 10/06/2020   Hearing loss 09/25/2020   Decreased appetite 09/25/2020   Sundowning 09/25/2020   Diarrhea 04/10/2020   Loss of memory 09/09/2018   Depression, prolonged  08/27/2018   Insomnia 08/27/2018   Bradycardia 01/29/2017   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 Authorizing Provider  aspirin EC 81 MG EC tablet Take 1 tablet (81 mg total) by mouth daily. Swallow whole. 01/24/21   Nolberto Hanlon, MD  diltiazem (CARDIZEM CD) 180 MG 24 hr capsule Take 1 capsule (180 mg total) by mouth daily. 10/09/20   Wouk, Ailene Rud, MD  escitalopram (LEXAPRO) 10 MG tablet Take 10 mg by mouth daily. 08/11/20   [provider]  lisinopril (ZESTRIL) 5 MG tablet Take 5 mg by mouth daily. 11/27/20   [provider]  memantine (NAMENDA) 5 MG tablet Take 1 tablet by mouth 2 (two) times daily. 01/14/21   [provider]  metoprolol tartrate (LOPRESSOR) 25 MG tablet Take 0.5 tablets (12.5 mg total) by mouth 2 (two) times daily. 01/24/21   Nolberto Hanlon, MD  oxybutynin (DITROPAN) 5 MG tablet Take 5 mg by mouth 2 (two) times daily. 11/09/20   [provider]  QUEtiapine (SEROQUEL) 25 MG tablet Take 0.5 tablets (12.5 mg total) by mouth at bedtime. 01/24/21   Nolberto Hanlon, MD  traZODone (DESYREL) 50 MG tablet Take 0.5 tablets (25 mg total) by mouth at bedtime. 01/24/21   Nolberto Hanlon, MD    Allergies Patient has no known allergies.  History reviewed. No pertinent  family history.  Social History Social History   Tobacco Use   Smoking status: Never   Smokeless tobacco: Never  Substance Use Topics   Alcohol use: Never   Drug use: Never    Review of Systems Level 5 caveat secondary to dementia   ____________________________________________   PHYSICAL EXAM:  VITAL SIGNS: ED Triage Vitals [02/06/21 2346]  Enc Vitals Group     BP 138/71     Pulse Rate 87     Resp 20     Temp 97.9 F (36.6 C)      Temp Source Oral     SpO2 98 %     Weight      Height      Head Circumference      Peak Flow      Pain Score      Pain Loc      Pain Edu?      Excl. in [place]?    CONSTITUTIONAL: Alert and oriented to self only elderly, in no distress HEAD: Normocephalic; atraumatic EYES: Conjunctivae clear, PERRL, EOMI, no conjunctival pallor ENT: normal nose; no rhinorrhea; moist mucous membranes; pharynx without lesions noted; no dental injury; no septal hematoma NECK: Supple, no meningismus, no LAD; no midline spinal tenderness, step-off or deformity; trachea midline CARD: Irregularly irregular and rate controlled, S1 and S2 appreciated; no murmurs, no clicks, no rubs, no gallops RESP: Normal chest excursion without splinting or tachypnea; breath sounds clear and equal bilaterally; no wheezes, no rhonchi, no rales; no hypoxia or respiratory distress, on room air CHEST:  chest wall stable, no crepitus or ecchymosis or deformity, nontender to palpation; no flail chest ABD/GI: Normal bowel sounds; non-distended; soft, non-tender, no rebound, no guarding; no ecchymosis or other lesions noted PELVIS:  stable, nontender to palpation, no leg length discrepancy BACK:  The back appears normal and is non-tender to palpation, there is no CVA tenderness; no midline spinal tenderness, step-off or deformity EXT: Normal ROM in all joints; non-tender to palpation; no edema; normal capillary refill; no cyanosis, no bony tenderness or bony deformity of patient's extremities, no joint effusion, compartments are soft, extremities are warm and well-perfused, no ecchymosis, I am able to fully range all joints in her lower extremities without pain SKIN: Normal color for age and race; warm NEURO: Moves all extremities equally, no facial asymmetry, normal speech PSYCH: The patient's mood and manner are appropriate. Grooming and personal hygiene are appropriate.  ____________________________________________   LABS (all labs  ordered are listed, but only abnormal results are displayed)  Labs Reviewed  BASIC METABOLIC PANEL - Abnormal; Notable for the following components:      Result Value   Anion gap 3 (*)    All other components within normal limits  URINALYSIS, COMPLETE (UACMP) WITH MICROSCOPIC - Abnormal; Notable for the following components:   Color, Urine STRAW (*)    APPearance CLEAR (*)    Leukocytes,Ua TRACE (*)    All other components within normal limits  CBC WITH DIFFERENTIAL/PLATELET  CBG MONITORING, ED  TROPONIN I (HIGH SENSITIVITY)  TROPONIN I (HIGH SENSITIVITY)   ____________________________________________  EKG   EKG Interpretation  Date/Time:  Wednesday February 06 2021 23:44:55 EDT Ventricular Rate:  86 PR Interval:    QRS Duration: 103 QT Interval:  380 QTC Calculation: 455 R Axis:   -39 Text Interpretation: Atrial fibrillation Incomplete RBBB and LAFB Abnormal R-wave progression, early transition Baseline wander in lead(s) V2 Confirmed by Pryor Curia [phone]) on 02/06/2021 11:52:40 PM  ____________________________________________  RADIOLOGY Jessie Foot Azlyn Wingler, personally viewed and evaluated these images (plain radiographs) as part of my medical decision making, as well as reviewing the written report by the radiologist.  ED MD interpretation: CT head and cervical spine show no acute traumatic injury other than some mild soft tissue swelling.  Official radiology report(s): CT Head Wo Contrast  Result Date: 02/07/2021 CLINICAL DATA:  Unwitnessed fall, found down EXAM: CT HEAD WITHOUT CONTRAST CT CERVICAL SPINE WITHOUT CONTRAST TECHNIQUE: Multidetector CT imaging of the head and cervical spine was performed following the standard protocol without intravenous contrast. Multiplanar CT image reconstructions of the cervical spine were also generated. COMPARISON:  CT head dated 01/21/2021 FINDINGS: CT HEAD FINDINGS Brain: No evidence of acute infarction, hemorrhage,  hydrocephalus, extra-axial collection or mass lesion/mass effect. Subcortical white matter and periventricular small vessel ischemic changes. Vascular: Intracranial atherosclerosis. Skull: Normal. Negative for fracture or focal lesion. Sinuses/Orbits: The visualized paranasal sinuses are essentially clear. The mastoid air cells are unopacified. Other: Mild soft tissue swelling overlying the right vertex (series 5/image 24). CT CERVICAL SPINE FINDINGS Alignment: Normal cervical lordosis. Skull base and vertebrae: No acute fracture. No primary bone lesion or focal pathologic process. Soft tissues and spinal canal: No prevertebral fluid or swelling. No visible canal hematoma. Disc levels: Mild degenerative changes of the mid cervical spine. 5 mm anterolisthesis of C7 on T1. Spinal canal is patent. Upper chest: Visualized lung apices are clear. Other: Visualized thyroid is unremarkable. IMPRESSION: Mild soft tissue swelling overlying the right vertex. No evidence of calvarial fracture. No evidence of acute intracranial abnormality. Small vessel ischemic changes. No evidence of traumatic injury to the cervical spine. Mild degenerative changes. Electronically Signed   By: Julian Hy M.D.   On: 02/07/2021 00:28   CT Cervical Spine Wo Contrast  Result Date: 02/07/2021 CLINICAL DATA:  Unwitnessed fall, found down EXAM: CT HEAD WITHOUT CONTRAST CT CERVICAL SPINE WITHOUT CONTRAST TECHNIQUE: Multidetector CT imaging of the head and cervical spine was performed following the standard protocol without intravenous contrast. Multiplanar CT image reconstructions of the cervical spine were also generated. COMPARISON:  CT head dated 01/21/2021 FINDINGS: CT HEAD FINDINGS Brain: No evidence of acute infarction, hemorrhage, hydrocephalus, extra-axial collection or mass lesion/mass effect. Subcortical white matter and periventricular small vessel ischemic changes. Vascular: Intracranial atherosclerosis. Skull: Normal. Negative  for fracture or focal lesion. Sinuses/Orbits: The visualized paranasal sinuses are essentially clear. The mastoid air cells are unopacified. Other: Mild soft tissue swelling overlying the right vertex (series 5/image 24). CT CERVICAL SPINE FINDINGS Alignment: Normal cervical lordosis. Skull base and vertebrae: No acute fracture. No primary bone lesion or focal pathologic process. Soft tissues and spinal canal: No prevertebral fluid or swelling. No visible canal hematoma. Disc levels: Mild degenerative changes of the mid cervical spine. 5 mm anterolisthesis of C7 on T1. Spinal canal is patent. Upper chest: Visualized lung apices are clear. Other: Visualized thyroid is unremarkable. IMPRESSION: Mild soft tissue swelling overlying the right vertex. No evidence of calvarial fracture. No evidence of acute intracranial abnormality. Small vessel ischemic changes. No evidence of traumatic injury to the cervical spine. Mild degenerative changes. Electronically Signed   By: Julian Hy M.D.   On: 02/07/2021 00:28    ____________________________________________   PROCEDURES  Procedure(s) performed (including Critical Care):  Procedures   ____________________________________________   INITIAL IMPRESSION / ASSESSMENT AND PLAN / ED COURSE  As part of my medical decision making, I reviewed the following data within the electronic MEDICAL RECORD NUMBER History  obtained from family, Nursing notes reviewed and incorporated, Labs reviewed , EKG interpreted , Old EKG reviewed, Old chart reviewed, Radiograph reviewed , and Notes from prior ED visits         Patient here from a nursing facility after an unwitnessed fall.  Will obtain CT of her head and cervical spine.  She was just discharged from the hospital on 01/24/2021 after syncopal event at home.  She was found to be septic from community-acquired pneumonia, had elevated troponins and A. fib with RVR, AKI.  We will recheck labs today, urine, blood glucose  ensure there is no other reason for her fall today.  Will obtain CT of her head and cervical spine.  No other sign of traumatic injury on exam.  She is afebrile here.  Lungs are clear to auscultation.  Oxygen saturation 90% without increased work of breathing.  EKG shows atrial fibrillation which is her baseline without ischemia.  Rate currently controlled.  ED PROGRESS  Patient continues to be at her baseline.  Work-up here unremarkable.  Normal blood counts, electrolytes, renal function.  No UTI.  CT head and cervical spine show no acute traumatic injury.  Troponin x2 negative.  Continues to be hemodynamically stable.  Discussed patient with her son Horton Chin by phone.  He is comfortable with plan for discharge back to [facility].   At this time, I do not feel there is any life-threatening condition present. I have reviewed, interpreted and discussed all results (EKG, imaging, lab, urine as appropriate) and exam findings with patient/family. I have reviewed nursing notes and appropriate previous records.  I feel the patient is safe to be discharged home without further emergent workup and can continue workup as an outpatient as needed. Discussed usual and customary return precautions. Patient/family verbalize understanding and are comfortable with this plan.  Outpatient follow-up has been provided as needed. All questions have been answered.    ____________________________________________   FINAL CLINICAL IMPRESSION(S) / ED DIAGNOSES  Final diagnoses:  Unwitnessed fall     ED Discharge Orders     None       *Please note:  EVONA WESTRA was evaluated in Emergency Department on 02/07/2021 for the symptoms described in the history of present illness. She was evaluated in the context of the global COVID-19 pandemic, which necessitated consideration that the patient might be at risk for infection with the SARS-CoV-2 virus that causes COVID-19. Institutional protocols and algorithms that  pertain to the evaluation of patients at risk for COVID-19 are in a state of rapid change based on information released by regulatory bodies including the CDC and federal and state organizations. These policies and algorithms were followed during the patient's care in the ED.  Some ED evaluations and interventions may be delayed as a result of limited staffing during and the pandemic.*   Note:  This document was prepared using Dragon voice recognition software and may include unintentional dictation errors.    Masyn Rostro, Delice Bison, DO 02/07/21 [phone]

## 2021-02-07 ENCOUNTER — Other Ambulatory Visit: Payer: PPO

## 2021-02-07 ENCOUNTER — Emergency Department: Payer: PPO

## 2021-02-07 DIAGNOSIS — G4700 Insomnia, unspecified: Secondary | ICD-10-CM | POA: Diagnosis not present

## 2021-02-07 DIAGNOSIS — R404 Transient alteration of awareness: Secondary | ICD-10-CM | POA: Diagnosis not present

## 2021-02-07 DIAGNOSIS — M47812 Spondylosis without myelopathy or radiculopathy, cervical region: Secondary | ICD-10-CM | POA: Diagnosis not present

## 2021-02-07 DIAGNOSIS — F418 Other specified anxiety disorders: Secondary | ICD-10-CM | POA: Diagnosis not present

## 2021-02-07 DIAGNOSIS — M4313 Spondylolisthesis, cervicothoracic region: Secondary | ICD-10-CM | POA: Diagnosis not present

## 2021-02-07 DIAGNOSIS — R279 Unspecified lack of coordination: Secondary | ICD-10-CM | POA: Diagnosis not present

## 2021-02-07 DIAGNOSIS — I10 Essential (primary) hypertension: Secondary | ICD-10-CM | POA: Diagnosis not present

## 2021-02-07 DIAGNOSIS — N3281 Overactive bladder: Secondary | ICD-10-CM | POA: Diagnosis not present

## 2021-02-07 DIAGNOSIS — R220 Localized swelling, mass and lump, head: Secondary | ICD-10-CM | POA: Diagnosis not present

## 2021-02-07 DIAGNOSIS — Z743 Need for continuous supervision: Secondary | ICD-10-CM | POA: Diagnosis not present

## 2021-02-07 DIAGNOSIS — E538 Deficiency of other specified B group vitamins: Secondary | ICD-10-CM | POA: Diagnosis not present

## 2021-02-07 DIAGNOSIS — W19XXXA Unspecified fall, initial encounter: Secondary | ICD-10-CM | POA: Diagnosis not present

## 2021-02-07 DIAGNOSIS — I4891 Unspecified atrial fibrillation: Secondary | ICD-10-CM | POA: Diagnosis not present

## 2021-02-07 DIAGNOSIS — R600 Localized edema: Secondary | ICD-10-CM | POA: Diagnosis not present

## 2021-02-07 LAB — CBC WITH DIFFERENTIAL/PLATELET
Abs Immature Granulocytes: 0.03 10*3/uL (ref 0.00–0.07)
Basophils Absolute: 0 10*3/uL (ref 0.0–0.1)
Basophils Relative: 0 %
Eosinophils Absolute: 0.1 10*3/uL (ref 0.0–0.5)
Eosinophils Relative: 2 %
HCT: 39.8 % (ref 36.0–46.0)
Hemoglobin: 12.9 g/dL (ref 12.0–15.0)
Immature Granulocytes: 1 %
Lymphocytes Relative: 34 %
Lymphs Abs: 1.8 10*3/uL (ref 0.7–4.0)
MCH: 31.4 pg (ref 26.0–34.0)
MCHC: 32.4 g/dL (ref 30.0–36.0)
MCV: 96.8 fL (ref 80.0–100.0)
Monocytes Absolute: 0.6 10*3/uL (ref 0.1–1.0)
Monocytes Relative: 12 %
Neutro Abs: 2.8 10*3/uL (ref 1.7–7.7)
Neutrophils Relative %: 51 %
Platelets: 291 10*3/uL (ref 150–400)
RBC: 4.11 MIL/uL (ref 3.87–5.11)
RDW: 13.7 % (ref 11.5–15.5)
WBC: 5.3 10*3/uL (ref 4.0–10.5)
nRBC: 0 % (ref 0.0–0.2)

## 2021-02-07 LAB — BASIC METABOLIC PANEL
Anion gap: 3 — ABNORMAL LOW (ref 5–15)
BUN: 15 mg/dL (ref 8–23)
CO2: 26 mmol/L (ref 22–32)
Calcium: 9.2 mg/dL (ref 8.9–10.3)
Chloride: 109 mmol/L (ref 98–111)
Creatinine, Ser: 0.84 mg/dL (ref 0.44–1.00)
GFR, Estimated: >60 mL/min (ref >60)
Glucose, Bld: 97 mg/dL (ref 70–99)
Potassium: 4 mmol/L (ref 3.5–5.1)
Sodium: 138 mmol/L (ref 135–145)

## 2021-02-07 LAB — URINALYSIS, COMPLETE (UACMP) WITH MICROSCOPIC
Bacteria, UA: NONE SEEN
Bilirubin Urine: NEGATIVE
Glucose, UA: NEGATIVE mg/dL
Hgb urine dipstick: NEGATIVE
Ketones, ur: NEGATIVE mg/dL
Nitrite: NEGATIVE
Protein, ur: NEGATIVE mg/dL
Specific Gravity, Urine: 1.006 (ref 1.005–1.030)
pH: 7 (ref 5.0–8.0)

## 2021-02-07 LAB — TROPONIN I (HIGH SENSITIVITY)
Troponin I (High Sensitivity): 8 ng/L (ref <18)
Troponin I (High Sensitivity): 8 ng/L (ref <18)

## 2021-02-07 NOTE — ED Notes (Signed)
Report called to [facility] staff Kalman Shan), awaiting EMS transport back.

## 2021-02-07 NOTE — ED Notes (Signed)
Report received from Mimi RN. Patient care assumed. Patient/RN introduction complete. Will continue to monitor.  

## 2021-02-07 NOTE — ED Notes (Signed)
Pt ambualted well with walker. Ward, MD notified.

## 2021-02-07 NOTE — Discharge Instructions (Addendum)
Patient's labs, urine, cardiac enzymes x2, CT head and cervical spine showed no acute abnormality today.

## 2021-02-08 DIAGNOSIS — R601 Generalized edema: Secondary | ICD-10-CM | POA: Diagnosis not present

## 2021-02-12 DIAGNOSIS — M6281 Muscle weakness (generalized): Secondary | ICD-10-CM | POA: Diagnosis not present

## 2021-02-12 DIAGNOSIS — E559 Vitamin D deficiency, unspecified: Secondary | ICD-10-CM | POA: Diagnosis not present

## 2021-02-12 DIAGNOSIS — E038 Other specified hypothyroidism: Secondary | ICD-10-CM | POA: Diagnosis not present

## 2021-02-12 DIAGNOSIS — F0390 Unspecified dementia without behavioral disturbance: Secondary | ICD-10-CM | POA: Diagnosis not present

## 2021-02-12 DIAGNOSIS — E7849 Other hyperlipidemia: Secondary | ICD-10-CM | POA: Diagnosis not present

## 2021-02-12 DIAGNOSIS — E119 Type 2 diabetes mellitus without complications: Secondary | ICD-10-CM | POA: Diagnosis not present

## 2021-02-12 DIAGNOSIS — Z79899 Other long term (current) drug therapy: Secondary | ICD-10-CM | POA: Diagnosis not present

## 2021-02-12 DIAGNOSIS — R531 Weakness: Secondary | ICD-10-CM | POA: Diagnosis not present

## 2021-02-12 DIAGNOSIS — W19XXXA Unspecified fall, initial encounter: Secondary | ICD-10-CM | POA: Diagnosis not present

## 2021-02-12 DIAGNOSIS — D518 Other vitamin B12 deficiency anemias: Secondary | ICD-10-CM | POA: Diagnosis not present

## 2021-02-12 DIAGNOSIS — I480 Paroxysmal atrial fibrillation: Secondary | ICD-10-CM | POA: Diagnosis not present

## 2021-02-12 DIAGNOSIS — R2681 Unsteadiness on feet: Secondary | ICD-10-CM | POA: Diagnosis not present

## 2021-02-12 DIAGNOSIS — I10 Essential (primary) hypertension: Secondary | ICD-10-CM | POA: Diagnosis not present

## 2021-02-14 DIAGNOSIS — D485 Neoplasm of uncertain behavior of skin: Secondary | ICD-10-CM | POA: Diagnosis not present

## 2021-02-14 DIAGNOSIS — C44222 Squamous cell carcinoma of skin of right ear and external auricular canal: Secondary | ICD-10-CM | POA: Diagnosis not present

## 2021-03-01 DIAGNOSIS — E038 Other specified hypothyroidism: Secondary | ICD-10-CM | POA: Diagnosis not present

## 2021-03-01 DIAGNOSIS — E119 Type 2 diabetes mellitus without complications: Secondary | ICD-10-CM | POA: Diagnosis not present

## 2021-03-01 DIAGNOSIS — I10 Essential (primary) hypertension: Secondary | ICD-10-CM | POA: Diagnosis not present

## 2021-03-01 DIAGNOSIS — I4891 Unspecified atrial fibrillation: Secondary | ICD-10-CM | POA: Diagnosis not present

## 2021-03-01 DIAGNOSIS — D518 Other vitamin B12 deficiency anemias: Secondary | ICD-10-CM | POA: Diagnosis not present

## 2021-03-01 DIAGNOSIS — E559 Vitamin D deficiency, unspecified: Secondary | ICD-10-CM | POA: Diagnosis not present

## 2021-03-05 DIAGNOSIS — R2681 Unsteadiness on feet: Secondary | ICD-10-CM | POA: Diagnosis not present

## 2021-03-05 DIAGNOSIS — W19XXXA Unspecified fall, initial encounter: Secondary | ICD-10-CM | POA: Diagnosis not present

## 2021-03-05 DIAGNOSIS — F0390 Unspecified dementia without behavioral disturbance: Secondary | ICD-10-CM | POA: Diagnosis not present

## 2021-03-05 DIAGNOSIS — R531 Weakness: Secondary | ICD-10-CM | POA: Diagnosis not present

## 2021-03-05 DIAGNOSIS — I480 Paroxysmal atrial fibrillation: Secondary | ICD-10-CM | POA: Diagnosis not present

## 2021-03-05 DIAGNOSIS — M6281 Muscle weakness (generalized): Secondary | ICD-10-CM | POA: Diagnosis not present

## 2021-03-05 DIAGNOSIS — I10 Essential (primary) hypertension: Secondary | ICD-10-CM | POA: Diagnosis not present

## 2021-03-07 DIAGNOSIS — R5381 Other malaise: Secondary | ICD-10-CM | POA: Diagnosis not present

## 2021-03-07 DIAGNOSIS — R600 Localized edema: Secondary | ICD-10-CM | POA: Diagnosis not present

## 2021-03-07 DIAGNOSIS — N3281 Overactive bladder: Secondary | ICD-10-CM | POA: Diagnosis not present

## 2021-03-07 DIAGNOSIS — I4891 Unspecified atrial fibrillation: Secondary | ICD-10-CM | POA: Diagnosis not present

## 2021-03-07 DIAGNOSIS — E538 Deficiency of other specified B group vitamins: Secondary | ICD-10-CM | POA: Diagnosis not present

## 2021-03-07 DIAGNOSIS — I10 Essential (primary) hypertension: Secondary | ICD-10-CM | POA: Diagnosis not present

## 2021-03-07 DIAGNOSIS — G4700 Insomnia, unspecified: Secondary | ICD-10-CM | POA: Diagnosis not present

## 2021-03-19 DIAGNOSIS — R531 Weakness: Secondary | ICD-10-CM | POA: Diagnosis not present

## 2021-03-19 DIAGNOSIS — R2681 Unsteadiness on feet: Secondary | ICD-10-CM | POA: Diagnosis not present

## 2021-03-19 DIAGNOSIS — W19XXXA Unspecified fall, initial encounter: Secondary | ICD-10-CM | POA: Diagnosis not present

## 2021-03-19 DIAGNOSIS — I10 Essential (primary) hypertension: Secondary | ICD-10-CM | POA: Diagnosis not present

## 2021-03-19 DIAGNOSIS — I480 Paroxysmal atrial fibrillation: Secondary | ICD-10-CM | POA: Diagnosis not present

## 2021-03-19 DIAGNOSIS — M6281 Muscle weakness (generalized): Secondary | ICD-10-CM | POA: Diagnosis not present

## 2021-03-19 DIAGNOSIS — F0390 Unspecified dementia without behavioral disturbance: Secondary | ICD-10-CM | POA: Diagnosis not present

## 2021-03-21 DIAGNOSIS — H1132 Conjunctival hemorrhage, left eye: Secondary | ICD-10-CM | POA: Diagnosis not present

## 2021-03-21 DIAGNOSIS — I4891 Unspecified atrial fibrillation: Secondary | ICD-10-CM | POA: Diagnosis not present

## 2021-03-21 DIAGNOSIS — F418 Other specified anxiety disorders: Secondary | ICD-10-CM | POA: Diagnosis not present

## 2021-03-21 DIAGNOSIS — G4700 Insomnia, unspecified: Secondary | ICD-10-CM | POA: Diagnosis not present

## 2021-03-21 DIAGNOSIS — R5381 Other malaise: Secondary | ICD-10-CM | POA: Diagnosis not present

## 2021-03-28 DIAGNOSIS — E538 Deficiency of other specified B group vitamins: Secondary | ICD-10-CM | POA: Diagnosis not present

## 2021-03-28 DIAGNOSIS — I4891 Unspecified atrial fibrillation: Secondary | ICD-10-CM | POA: Diagnosis not present

## 2021-03-28 DIAGNOSIS — I10 Essential (primary) hypertension: Secondary | ICD-10-CM | POA: Diagnosis not present

## 2021-03-28 DIAGNOSIS — N3281 Overactive bladder: Secondary | ICD-10-CM | POA: Diagnosis not present

## 2021-03-28 DIAGNOSIS — G4700 Insomnia, unspecified: Secondary | ICD-10-CM | POA: Diagnosis not present

## 2021-03-28 DIAGNOSIS — R5381 Other malaise: Secondary | ICD-10-CM | POA: Diagnosis not present

## 2021-04-01 DIAGNOSIS — D518 Other vitamin B12 deficiency anemias: Secondary | ICD-10-CM | POA: Diagnosis not present

## 2021-04-01 DIAGNOSIS — E119 Type 2 diabetes mellitus without complications: Secondary | ICD-10-CM | POA: Diagnosis not present

## 2021-04-01 DIAGNOSIS — I10 Essential (primary) hypertension: Secondary | ICD-10-CM | POA: Diagnosis not present

## 2021-04-01 DIAGNOSIS — E559 Vitamin D deficiency, unspecified: Secondary | ICD-10-CM | POA: Diagnosis not present

## 2021-04-01 DIAGNOSIS — E038 Other specified hypothyroidism: Secondary | ICD-10-CM | POA: Diagnosis not present

## 2021-04-01 DIAGNOSIS — I4891 Unspecified atrial fibrillation: Secondary | ICD-10-CM | POA: Diagnosis not present

## 2021-04-04 DIAGNOSIS — M25472 Effusion, left ankle: Secondary | ICD-10-CM | POA: Diagnosis not present

## 2021-04-04 DIAGNOSIS — I4891 Unspecified atrial fibrillation: Secondary | ICD-10-CM | POA: Diagnosis not present

## 2021-04-04 DIAGNOSIS — G4700 Insomnia, unspecified: Secondary | ICD-10-CM | POA: Diagnosis not present

## 2021-04-04 DIAGNOSIS — I10 Essential (primary) hypertension: Secondary | ICD-10-CM | POA: Diagnosis not present

## 2021-04-05 DIAGNOSIS — I480 Paroxysmal atrial fibrillation: Secondary | ICD-10-CM | POA: Diagnosis not present

## 2021-04-05 DIAGNOSIS — W19XXXA Unspecified fall, initial encounter: Secondary | ICD-10-CM | POA: Diagnosis not present

## 2021-04-05 DIAGNOSIS — I10 Essential (primary) hypertension: Secondary | ICD-10-CM | POA: Diagnosis not present

## 2021-04-05 DIAGNOSIS — M6281 Muscle weakness (generalized): Secondary | ICD-10-CM | POA: Diagnosis not present

## 2021-04-05 DIAGNOSIS — F0390 Unspecified dementia without behavioral disturbance: Secondary | ICD-10-CM | POA: Diagnosis not present

## 2021-04-05 DIAGNOSIS — R531 Weakness: Secondary | ICD-10-CM | POA: Diagnosis not present

## 2021-04-05 DIAGNOSIS — R2681 Unsteadiness on feet: Secondary | ICD-10-CM | POA: Diagnosis not present

## 2021-04-25 DIAGNOSIS — S51812A Laceration without foreign body of left forearm, initial encounter: Secondary | ICD-10-CM | POA: Diagnosis not present

## 2021-04-25 DIAGNOSIS — R600 Localized edema: Secondary | ICD-10-CM | POA: Diagnosis not present

## 2021-04-25 DIAGNOSIS — R451 Restlessness and agitation: Secondary | ICD-10-CM | POA: Diagnosis not present

## 2021-04-25 DIAGNOSIS — I4891 Unspecified atrial fibrillation: Secondary | ICD-10-CM | POA: Diagnosis not present

## 2021-04-25 DIAGNOSIS — I10 Essential (primary) hypertension: Secondary | ICD-10-CM | POA: Diagnosis not present

## 2021-04-25 DIAGNOSIS — G4700 Insomnia, unspecified: Secondary | ICD-10-CM | POA: Diagnosis not present

## 2021-04-29 DIAGNOSIS — S51802A Unspecified open wound of left forearm, initial encounter: Secondary | ICD-10-CM | POA: Diagnosis not present

## 2021-05-01 DIAGNOSIS — E559 Vitamin D deficiency, unspecified: Secondary | ICD-10-CM | POA: Diagnosis not present

## 2021-05-01 DIAGNOSIS — I4891 Unspecified atrial fibrillation: Secondary | ICD-10-CM | POA: Diagnosis not present

## 2021-05-01 DIAGNOSIS — D518 Other vitamin B12 deficiency anemias: Secondary | ICD-10-CM | POA: Diagnosis not present

## 2021-05-01 DIAGNOSIS — E038 Other specified hypothyroidism: Secondary | ICD-10-CM | POA: Diagnosis not present

## 2021-05-01 DIAGNOSIS — E119 Type 2 diabetes mellitus without complications: Secondary | ICD-10-CM | POA: Diagnosis not present

## 2021-05-01 DIAGNOSIS — I10 Essential (primary) hypertension: Secondary | ICD-10-CM | POA: Diagnosis not present

## 2021-05-08 DIAGNOSIS — R451 Restlessness and agitation: Secondary | ICD-10-CM | POA: Diagnosis not present

## 2021-05-08 DIAGNOSIS — S51812A Laceration without foreign body of left forearm, initial encounter: Secondary | ICD-10-CM | POA: Diagnosis not present

## 2021-05-08 DIAGNOSIS — I10 Essential (primary) hypertension: Secondary | ICD-10-CM | POA: Diagnosis not present

## 2021-05-08 DIAGNOSIS — Z7982 Long term (current) use of aspirin: Secondary | ICD-10-CM | POA: Diagnosis not present

## 2021-05-08 DIAGNOSIS — G309 Alzheimer's disease, unspecified: Secondary | ICD-10-CM | POA: Diagnosis not present

## 2021-05-08 DIAGNOSIS — F418 Other specified anxiety disorders: Secondary | ICD-10-CM | POA: Diagnosis not present

## 2021-05-08 DIAGNOSIS — I4891 Unspecified atrial fibrillation: Secondary | ICD-10-CM | POA: Diagnosis not present

## 2021-05-08 DIAGNOSIS — F03918 Unspecified dementia, unspecified severity, with other behavioral disturbance: Secondary | ICD-10-CM | POA: Diagnosis not present

## 2021-05-16 DIAGNOSIS — I10 Essential (primary) hypertension: Secondary | ICD-10-CM | POA: Diagnosis not present

## 2021-05-16 DIAGNOSIS — R451 Restlessness and agitation: Secondary | ICD-10-CM | POA: Diagnosis not present

## 2021-05-16 DIAGNOSIS — G4700 Insomnia, unspecified: Secondary | ICD-10-CM | POA: Diagnosis not present

## 2021-05-16 DIAGNOSIS — I4891 Unspecified atrial fibrillation: Secondary | ICD-10-CM | POA: Diagnosis not present

## 2021-05-16 DIAGNOSIS — R600 Localized edema: Secondary | ICD-10-CM | POA: Diagnosis not present

## 2021-05-29 DIAGNOSIS — Z7982 Long term (current) use of aspirin: Secondary | ICD-10-CM | POA: Diagnosis not present

## 2021-05-29 DIAGNOSIS — E038 Other specified hypothyroidism: Secondary | ICD-10-CM | POA: Diagnosis not present

## 2021-05-29 DIAGNOSIS — F03918 Unspecified dementia, unspecified severity, with other behavioral disturbance: Secondary | ICD-10-CM | POA: Diagnosis not present

## 2021-05-29 DIAGNOSIS — I10 Essential (primary) hypertension: Secondary | ICD-10-CM | POA: Diagnosis not present

## 2021-05-29 DIAGNOSIS — E559 Vitamin D deficiency, unspecified: Secondary | ICD-10-CM | POA: Diagnosis not present

## 2021-05-29 DIAGNOSIS — S51812A Laceration without foreign body of left forearm, initial encounter: Secondary | ICD-10-CM | POA: Diagnosis not present

## 2021-05-29 DIAGNOSIS — R451 Restlessness and agitation: Secondary | ICD-10-CM | POA: Diagnosis not present

## 2021-05-29 DIAGNOSIS — I4891 Unspecified atrial fibrillation: Secondary | ICD-10-CM | POA: Diagnosis not present

## 2021-05-29 DIAGNOSIS — E119 Type 2 diabetes mellitus without complications: Secondary | ICD-10-CM | POA: Diagnosis not present

## 2021-05-29 DIAGNOSIS — G309 Alzheimer's disease, unspecified: Secondary | ICD-10-CM | POA: Diagnosis not present

## 2021-05-29 DIAGNOSIS — D518 Other vitamin B12 deficiency anemias: Secondary | ICD-10-CM | POA: Diagnosis not present

## 2021-05-29 DIAGNOSIS — F418 Other specified anxiety disorders: Secondary | ICD-10-CM | POA: Diagnosis not present

## 2021-05-30 DIAGNOSIS — R600 Localized edema: Secondary | ICD-10-CM | POA: Diagnosis not present

## 2021-05-30 DIAGNOSIS — I10 Essential (primary) hypertension: Secondary | ICD-10-CM | POA: Diagnosis not present

## 2021-05-30 DIAGNOSIS — G4700 Insomnia, unspecified: Secondary | ICD-10-CM | POA: Diagnosis not present

## 2021-05-30 DIAGNOSIS — R451 Restlessness and agitation: Secondary | ICD-10-CM | POA: Diagnosis not present

## 2021-05-30 DIAGNOSIS — I4891 Unspecified atrial fibrillation: Secondary | ICD-10-CM | POA: Diagnosis not present

## 2021-06-13 DIAGNOSIS — R451 Restlessness and agitation: Secondary | ICD-10-CM | POA: Diagnosis not present

## 2021-06-13 DIAGNOSIS — R600 Localized edema: Secondary | ICD-10-CM | POA: Diagnosis not present

## 2021-06-13 DIAGNOSIS — I10 Essential (primary) hypertension: Secondary | ICD-10-CM | POA: Diagnosis not present

## 2021-06-13 DIAGNOSIS — G4700 Insomnia, unspecified: Secondary | ICD-10-CM | POA: Diagnosis not present

## 2021-06-13 DIAGNOSIS — I4891 Unspecified atrial fibrillation: Secondary | ICD-10-CM | POA: Diagnosis not present

## 2021-06-14 DIAGNOSIS — Z79899 Other long term (current) drug therapy: Secondary | ICD-10-CM | POA: Diagnosis not present

## 2021-06-14 DIAGNOSIS — E119 Type 2 diabetes mellitus without complications: Secondary | ICD-10-CM | POA: Diagnosis not present

## 2021-06-14 DIAGNOSIS — E038 Other specified hypothyroidism: Secondary | ICD-10-CM | POA: Diagnosis not present

## 2021-06-14 DIAGNOSIS — D518 Other vitamin B12 deficiency anemias: Secondary | ICD-10-CM | POA: Diagnosis not present

## 2021-06-14 DIAGNOSIS — E7849 Other hyperlipidemia: Secondary | ICD-10-CM | POA: Diagnosis not present

## 2021-06-14 DIAGNOSIS — E559 Vitamin D deficiency, unspecified: Secondary | ICD-10-CM | POA: Diagnosis not present

## 2021-06-20 DIAGNOSIS — R451 Restlessness and agitation: Secondary | ICD-10-CM | POA: Diagnosis not present

## 2021-06-25 DIAGNOSIS — E559 Vitamin D deficiency, unspecified: Secondary | ICD-10-CM | POA: Diagnosis not present

## 2021-06-25 DIAGNOSIS — D518 Other vitamin B12 deficiency anemias: Secondary | ICD-10-CM | POA: Diagnosis not present

## 2021-06-25 DIAGNOSIS — I10 Essential (primary) hypertension: Secondary | ICD-10-CM | POA: Diagnosis not present

## 2021-06-25 DIAGNOSIS — E038 Other specified hypothyroidism: Secondary | ICD-10-CM | POA: Diagnosis not present

## 2021-06-25 DIAGNOSIS — E119 Type 2 diabetes mellitus without complications: Secondary | ICD-10-CM | POA: Diagnosis not present

## 2021-06-25 DIAGNOSIS — I4891 Unspecified atrial fibrillation: Secondary | ICD-10-CM | POA: Diagnosis not present

## 2021-07-18 DIAGNOSIS — R451 Restlessness and agitation: Secondary | ICD-10-CM | POA: Diagnosis not present

## 2021-07-18 DIAGNOSIS — G4700 Insomnia, unspecified: Secondary | ICD-10-CM | POA: Diagnosis not present

## 2021-07-18 DIAGNOSIS — I10 Essential (primary) hypertension: Secondary | ICD-10-CM | POA: Diagnosis not present

## 2021-07-18 DIAGNOSIS — R4689 Other symptoms and signs involving appearance and behavior: Secondary | ICD-10-CM | POA: Diagnosis not present

## 2021-07-18 DIAGNOSIS — R600 Localized edema: Secondary | ICD-10-CM | POA: Diagnosis not present

## 2021-07-18 DIAGNOSIS — I4891 Unspecified atrial fibrillation: Secondary | ICD-10-CM | POA: Diagnosis not present

## 2021-07-19 DIAGNOSIS — F03918 Unspecified dementia, unspecified severity, with other behavioral disturbance: Secondary | ICD-10-CM | POA: Diagnosis not present

## 2021-07-19 DIAGNOSIS — F32A Depression, unspecified: Secondary | ICD-10-CM | POA: Diagnosis not present

## 2021-07-23 DIAGNOSIS — E038 Other specified hypothyroidism: Secondary | ICD-10-CM | POA: Diagnosis not present

## 2021-07-23 DIAGNOSIS — E119 Type 2 diabetes mellitus without complications: Secondary | ICD-10-CM | POA: Diagnosis not present

## 2021-07-23 DIAGNOSIS — E559 Vitamin D deficiency, unspecified: Secondary | ICD-10-CM | POA: Diagnosis not present

## 2021-07-23 DIAGNOSIS — D518 Other vitamin B12 deficiency anemias: Secondary | ICD-10-CM | POA: Diagnosis not present

## 2021-07-23 DIAGNOSIS — I10 Essential (primary) hypertension: Secondary | ICD-10-CM | POA: Diagnosis not present

## 2021-07-23 DIAGNOSIS — I4891 Unspecified atrial fibrillation: Secondary | ICD-10-CM | POA: Diagnosis not present

## 2021-07-27 ENCOUNTER — Other Ambulatory Visit: Payer: Self-pay

## 2021-07-27 DIAGNOSIS — R5381 Other malaise: Secondary | ICD-10-CM | POA: Diagnosis not present

## 2021-07-27 DIAGNOSIS — R519 Headache, unspecified: Secondary | ICD-10-CM | POA: Diagnosis not present

## 2021-07-27 DIAGNOSIS — W19XXXA Unspecified fall, initial encounter: Secondary | ICD-10-CM | POA: Insufficient documentation

## 2021-07-27 DIAGNOSIS — S0990XA Unspecified injury of head, initial encounter: Secondary | ICD-10-CM | POA: Diagnosis present

## 2021-07-27 DIAGNOSIS — Y92129 Unspecified place in nursing home as the place of occurrence of the external cause: Secondary | ICD-10-CM | POA: Insufficient documentation

## 2021-07-27 DIAGNOSIS — I10 Essential (primary) hypertension: Secondary | ICD-10-CM | POA: Insufficient documentation

## 2021-07-27 DIAGNOSIS — N390 Urinary tract infection, site not specified: Secondary | ICD-10-CM | POA: Insufficient documentation

## 2021-07-27 DIAGNOSIS — Z79899 Other long term (current) drug therapy: Secondary | ICD-10-CM | POA: Diagnosis not present

## 2021-07-27 DIAGNOSIS — S0181XA Laceration without foreign body of other part of head, initial encounter: Secondary | ICD-10-CM | POA: Insufficient documentation

## 2021-07-27 DIAGNOSIS — Z7982 Long term (current) use of aspirin: Secondary | ICD-10-CM | POA: Diagnosis not present

## 2021-07-27 DIAGNOSIS — B9689 Other specified bacterial agents as the cause of diseases classified elsewhere: Secondary | ICD-10-CM | POA: Diagnosis not present

## 2021-07-27 DIAGNOSIS — F0390 Unspecified dementia without behavioral disturbance: Secondary | ICD-10-CM | POA: Insufficient documentation

## 2021-07-27 DIAGNOSIS — M542 Cervicalgia: Secondary | ICD-10-CM | POA: Diagnosis not present

## 2021-07-27 NOTE — ED Triage Notes (Signed)
Pt to ED via [facility].  Pt from [facility] facility. Pt has hx of dementia. Pt was found in floor covered in fecal matter.  Pt has hematoma and small laceration above right eye.  Pt denies pain at this time.

## 2021-07-28 ENCOUNTER — Emergency Department: Payer: PPO

## 2021-07-28 ENCOUNTER — Emergency Department
Admission: EM | Admit: 2021-07-28 | Discharge: 2021-07-28 | Disposition: A | Discharge status: Home / Self Care | Payer: PPO | Attending: Emergency Medicine | Admitting: Emergency Medicine

## 2021-07-28 DIAGNOSIS — S0181XA Laceration without foreign body of other part of head, initial encounter: Secondary | ICD-10-CM | POA: Diagnosis not present

## 2021-07-28 DIAGNOSIS — S0990XA Unspecified injury of head, initial encounter: Secondary | ICD-10-CM

## 2021-07-28 DIAGNOSIS — N390 Urinary tract infection, site not specified: Secondary | ICD-10-CM

## 2021-07-28 DIAGNOSIS — W19XXXA Unspecified fall, initial encounter: Secondary | ICD-10-CM

## 2021-07-28 DIAGNOSIS — R0902 Hypoxemia: Secondary | ICD-10-CM | POA: Diagnosis not present

## 2021-07-28 DIAGNOSIS — R531 Weakness: Secondary | ICD-10-CM | POA: Diagnosis not present

## 2021-07-28 DIAGNOSIS — Z7401 Bed confinement status: Secondary | ICD-10-CM | POA: Diagnosis not present

## 2021-07-28 DIAGNOSIS — R519 Headache, unspecified: Secondary | ICD-10-CM | POA: Diagnosis not present

## 2021-07-28 DIAGNOSIS — M542 Cervicalgia: Secondary | ICD-10-CM | POA: Diagnosis not present

## 2021-07-28 HISTORY — DX: Hyperlipidemia, unspecified: E78.5

## 2021-07-28 HISTORY — DX: Depression, unspecified: F32.A

## 2021-07-28 HISTORY — DX: Unspecified atrial fibrillation: I48.91

## 2021-07-28 LAB — CBC WITH DIFFERENTIAL/PLATELET
Abs Immature Granulocytes: 0.07 10*3/uL (ref 0.00–0.07)
Basophils Absolute: 0 10*3/uL (ref 0.0–0.1)
Basophils Relative: 0 %
Eosinophils Absolute: 0.1 10*3/uL (ref 0.0–0.5)
Eosinophils Relative: 1 %
HCT: 42.6 % (ref 36.0–46.0)
Hemoglobin: 13.8 g/dL (ref 12.0–15.0)
Immature Granulocytes: 1 %
Lymphocytes Relative: 8 %
Lymphs Abs: 0.9 10*3/uL (ref 0.7–4.0)
MCH: 30.9 pg (ref 26.0–34.0)
MCHC: 32.4 g/dL (ref 30.0–36.0)
MCV: 95.5 fL (ref 80.0–100.0)
Monocytes Absolute: 0.6 10*3/uL (ref 0.1–1.0)
Monocytes Relative: 6 %
Neutro Abs: 8.9 10*3/uL — ABNORMAL HIGH (ref 1.7–7.7)
Neutrophils Relative %: 84 %
Platelets: 211 10*3/uL (ref 150–400)
RBC: 4.46 MIL/uL (ref 3.87–5.11)
RDW: 14.5 % (ref 11.5–15.5)
WBC: 10.5 10*3/uL (ref 4.0–10.5)
nRBC: 0 % (ref 0.0–0.2)

## 2021-07-28 LAB — URINALYSIS, COMPLETE (UACMP) WITH MICROSCOPIC
Bilirubin Urine: NEGATIVE
Glucose, UA: NEGATIVE mg/dL
Nitrite: POSITIVE — AB
Protein, ur: NEGATIVE mg/dL
Specific Gravity, Urine: 1.02 (ref 1.005–1.030)
pH: 7 (ref 5.0–8.0)

## 2021-07-28 LAB — LIPASE, BLOOD: Lipase: 35 U/L (ref 11–51)

## 2021-07-28 LAB — COMPREHENSIVE METABOLIC PANEL
ALT: 18 U/L (ref 0–44)
AST: 25 U/L (ref 15–41)
Albumin: 3.6 g/dL (ref 3.5–5.0)
Alkaline Phosphatase: 68 U/L (ref 38–126)
Anion gap: 5 (ref 5–15)
BUN: 22 mg/dL (ref 8–23)
CO2: 24 mmol/L (ref 22–32)
Calcium: 9.4 mg/dL (ref 8.9–10.3)
Chloride: 106 mmol/L (ref 98–111)
Creatinine, Ser: 0.76 mg/dL (ref 0.44–1.00)
GFR, Estimated: >60 mL/min (ref >60)
Glucose, Bld: 124 mg/dL — ABNORMAL HIGH (ref 70–99)
Potassium: 4.2 mmol/L (ref 3.5–5.1)
Sodium: 135 mmol/L (ref 135–145)
Total Bilirubin: 0.8 mg/dL (ref 0.3–1.2)
Total Protein: 6.8 g/dL (ref 6.5–8.1)

## 2021-07-28 MED ORDER — CEPHALEXIN 250 MG PO CAPS: 250.0000 mg | ORAL_CAPSULE | Freq: Three times a day (TID) | ORAL | 0 refills | Status: AC

## 2021-07-28 MED ORDER — CEPHALEXIN 250 MG PO CAPS
250.0000 mg | ORAL_CAPSULE | Freq: Once | ORAL | Status: AC
Start: 2021-07-28 — End: 2021-07-28
  Administered 2021-07-28: 06:00:00 250 mg via ORAL
  Filled 2021-07-28: qty 1

## 2021-07-28 NOTE — Discharge Instructions (Addendum)
You may remove Dermaclip in 5 days.  Take Keflex 250mg  three times daily x 7 days. Return to the ER for worsening symptoms, persistent vomiting, lethargy or other concerns

## 2021-07-28 NOTE — ED Notes (Addendum)
Report called to [facility] [phone]

## 2021-07-28 NOTE — ED Notes (Signed)
Pt alert, talking to RN at this time.

## 2021-07-28 NOTE — ED Notes (Signed)
Pt resting with eyes closed, respirations even and unlabored at this time.

## 2021-07-28 NOTE — ED Provider Notes (Signed)
[facility] Emergency Department Provider Note   ____________________________________________   Event Date/Time   First MD Initiated Contact with Patient 07/28/21 0231     (approximate)  I have reviewed the triage vital signs and the nursing notes.   HISTORY  Chief Complaint Fall  Level V caveat: Limited by dementia  HPI Amy Stanley is a 85 y.o. female brought to the ED via EMS from [facility] status post unwitnessed fall.  Patient was found on her floor covered in fecal matter.  Presents with small laceration to right forehead.  Patient denies pain.  Does not appear to be on anticoagulants.  History is limited secondary to patient's dementia.     Past Medical History:  Diagnosis Date   Atrial fibrillation ([place])    Depression    Hyperlipidemia    Hypertension     Patient Active Problem List   Diagnosis Date Noted   CAP (community acquired pneumonia) 01/17/2021   Severe sepsis ([place]) 01/17/2021   Elevated troponin 01/17/2021   AKI (acute kidney injury) ([place]) 01/17/2021   Mild concentric left ventricular hypertrophy (LVH) 11/01/2020   Moderate aortic regurgitation 11/01/2020   Atrial fibrillation with rapid ventricular response ([place]) 10/06/2020   Acute lower UTI 10/06/2020   Syncope and collapse 10/06/2020   Dementia ([place]) 10/06/2020   Hearing loss 09/25/2020   Decreased appetite 09/25/2020   Sundowning 09/25/2020   Diarrhea 04/10/2020   Loss of memory 09/09/2018   Depression, prolonged 08/27/2018   Insomnia 08/27/2018   Bradycardia 01/29/2017   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 Authorizing Provider  cephALEXin (KEFLEX) 250 MG capsule Take 1 capsule (250 mg total) by mouth 3  (three) times daily. 07/28/21  Yes Paulette Blanch, MD  aspirin EC 81 MG EC tablet Take 1 tablet (81 mg total) by mouth daily. Swallow whole. 01/24/21   Nolberto Hanlon, MD  diltiazem (CARDIZEM CD) 180 MG 24 hr capsule Take 1 capsule (180 mg total) by mouth daily. 10/09/20   Wouk, Ailene Rud, MD  escitalopram (LEXAPRO) 10 MG tablet Take 10 mg by mouth daily. 08/11/20   [provider]  lisinopril (ZESTRIL) 5 MG tablet Take 5 mg by mouth daily. 11/27/20   [provider]  memantine (NAMENDA) 5 MG tablet Take 1 tablet by mouth 2 (two) times daily. 01/14/21   [provider]  metoprolol tartrate (LOPRESSOR) 25 MG tablet Take 0.5 tablets (12.5 mg total) by mouth 2 (two) times daily. 01/24/21   Nolberto Hanlon, MD  oxybutynin (DITROPAN) 5 MG tablet Take 5 mg by mouth 2 (two) times daily. 11/09/20   [provider]  QUEtiapine (SEROQUEL) 25 MG tablet Take 0.5 tablets (12.5 mg total) by mouth at bedtime. 01/24/21   Nolberto Hanlon, MD  traZODone (DESYREL) 50 MG tablet Take 0.5 tablets (25 mg total) by mouth at bedtime. 01/24/21   Nolberto Hanlon, MD    Allergies Patient has no known allergies.  History reviewed. No pertinent family history.  Social History Social History   Tobacco Use   Smoking status: Never   Smokeless tobacco: Never  Vaping Use   Vaping Use: Never used  Substance Use Topics   Alcohol use: Never   Drug use: Never  Review of Systems  Constitutional: No fever/chills Eyes: No visual changes. ENT: Positive for facial laceration.  No sore throat. Cardiovascular: Denies chest pain. Respiratory: Denies shortness of breath. Gastrointestinal: No abdominal pain.  No nausea, no vomiting.  No diarrhea.  No constipation. Genitourinary: Negative for dysuria. Musculoskeletal: Negative for back pain. Skin: Negative for rash. Neurological: Negative for headaches, focal weakness or numbness.   ____________________________________________   PHYSICAL EXAM:  VITAL  SIGNS: ED Triage Vitals  Enc Vitals Group     BP 07/27/21 2329 110/70     Pulse Rate 07/27/21 2329 85     Resp 07/27/21 2329 18     Temp 07/27/21 2329 97.7 F (36.5 C)     Temp Source 07/27/21 2329 Oral     SpO2 07/27/21 2329 94 %     Weight 07/27/21 2326 125 lb (56.7 kg)     Height 07/27/21 2326 5\' 1"  (1.549 m)     Head Circumference --      Peak Flow --      Pain Score 07/27/21 2326 0     Pain Loc --      Pain Edu? --      Excl. in [place]? --     Constitutional: Alert and oriented.  Elderly appearing and in no acute distress. Eyes: Conjunctivae are normal. PERRL. EOMI. Head: 0.75cm linear right forehead laceration without active bleeding. Nose: Atraumatic. Mouth/Throat: Mucous membranes are mildly dry.  No dental malocclusion.   Neck: No stridor.  No cervical spine tenderness to palpation.  No step-offs or deformities noted. Cardiovascular: Normal rate, regular rhythm. Grossly normal heart sounds.  Good peripheral circulation. Respiratory: Normal respiratory effort.  No retractions. Lungs CTAB. Gastrointestinal: Soft and nontender. No distention. No abdominal bruits. No CVA tenderness. Musculoskeletal: No lower extremity tenderness nor edema.  No joint effusions. Neurologic: Alert and oriented to person.  Normal speech and language. No gross focal neurologic deficits are appreciated. MAEx4. Skin:  Skin is warm, dry and intact. No rash noted. Psychiatric: Mood and affect are normal. Speech and behavior are normal.  ____________________________________________   LABS (all labs ordered are listed, but only abnormal results are displayed)  Labs Reviewed  CBC WITH DIFFERENTIAL/PLATELET - Abnormal; Notable for the following components:      Result Value   Neutro Abs 8.9 (*)    All other components within normal limits  COMPREHENSIVE METABOLIC PANEL - Abnormal; Notable for the following components:   Glucose, Bld 124 (*)    All other components within normal limits  URINALYSIS,  COMPLETE (UACMP) WITH MICROSCOPIC - Abnormal; Notable for the following components:   APPearance CLOUDY (*)    Hgb urine dipstick TRACE (*)    Ketones, ur TRACE (*)    Nitrite POSITIVE (*)    Leukocytes,Ua SMALL (*)    Bacteria, UA MANY (*)    All other components within normal limits  GASTROINTESTINAL PANEL BY PCR, STOOL (REPLACES STOOL CULTURE)  LIPASE, BLOOD   ____________________________________________  EKG  None ____________________________________________  RADIOLOGY I, Ishana Blades J, personally viewed and evaluated these images (plain radiographs) as part of my medical decision making, as well as reviewing the written report by the radiologist.  ED MD interpretation: No ICH or cervical spine injury  Official radiology report(s): CT Head Wo Contrast  Result Date: 07/28/2021 CLINICAL DATA:  Recent fall with headaches and neck pain, initial encounter EXAM: CT HEAD WITHOUT CONTRAST CT CERVICAL SPINE WITHOUT CONTRAST TECHNIQUE: Multidetector CT imaging of the head and cervical spine was  performed following the standard protocol without intravenous contrast. Multiplanar CT image reconstructions of the cervical spine were also generated. COMPARISON:  02/07/2021 FINDINGS: CT HEAD FINDINGS Brain: Mild atrophic changes are noted. Chronic white matter ischemic changes are seen. No findings to suggest acute hemorrhage, acute infarction or space-occupying mass lesion are seen. Vascular: No hyperdense vessel or unexpected calcification. Skull: Normal. Negative for fracture or focal lesion. Sinuses/Orbits: No acute finding. Other: None. CT CERVICAL SPINE FINDINGS Alignment: Degenerative anterolisthesis of C7 on T1 is noted. Skull base and vertebrae: 7 cervical segments are well visualized. Vertebral body height is well maintained. Multilevel disc space narrowing is seen. Mild osteophytic changes and facet hypertrophic changes are noted throughout the cervical spine. No acute fracture or acute facet  abnormality is noted. Soft tissues and spinal canal: Cirrhotic soft tissue structures are within normal limits. Upper chest: Visualized lung apices are within normal limits. Other: None IMPRESSION: CT of the head: Chronic atrophic and ischemic changes without acute abnormality. CT of cervical spine: Multilevel degenerative changes with anterolisthesis of C7 on T1 stable from the prior exam. No acute abnormality noted. Electronically Signed   By: Inez Catalina M.D.   On: 07/28/2021 00:42   CT CERVICAL SPINE WO CONTRAST  Result Date: 07/28/2021 CLINICAL DATA:  Recent fall with headaches and neck pain, initial encounter EXAM: CT HEAD WITHOUT CONTRAST CT CERVICAL SPINE WITHOUT CONTRAST TECHNIQUE: Multidetector CT imaging of the head and cervical spine was performed following the standard protocol without intravenous contrast. Multiplanar CT image reconstructions of the cervical spine were also generated. COMPARISON:  02/07/2021 FINDINGS: CT HEAD FINDINGS Brain: Mild atrophic changes are noted. Chronic white matter ischemic changes are seen. No findings to suggest acute hemorrhage, acute infarction or space-occupying mass lesion are seen. Vascular: No hyperdense vessel or unexpected calcification. Skull: Normal. Negative for fracture or focal lesion. Sinuses/Orbits: No acute finding. Other: None. CT CERVICAL SPINE FINDINGS Alignment: Degenerative anterolisthesis of C7 on T1 is noted. Skull base and vertebrae: 7 cervical segments are well visualized. Vertebral body height is well maintained. Multilevel disc space narrowing is seen. Mild osteophytic changes and facet hypertrophic changes are noted throughout the cervical spine. No acute fracture or acute facet abnormality is noted. Soft tissues and spinal canal: Cirrhotic soft tissue structures are within normal limits. Upper chest: Visualized lung apices are within normal limits. Other: None IMPRESSION: CT of the head: Chronic atrophic and ischemic changes without  acute abnormality. CT of cervical spine: Multilevel degenerative changes with anterolisthesis of C7 on T1 stable from the prior exam. No acute abnormality noted. Electronically Signed   By: Inez Catalina M.D.   On: 07/28/2021 00:42    ____________________________________________   PROCEDURES  Procedure(s) performed (including Critical Care):  Marland KitchenMarland KitchenLaceration Repair  Date/Time: 07/28/2021 2:38 AM Performed by: Paulette Blanch, MD Authorized by: Paulette Blanch, MD   Consent:    Consent obtained:  Verbal   Consent given by:  Patient   Risks discussed:  Infection, pain, poor cosmetic result and poor wound healing   Alternatives discussed:  No treatment Universal protocol:    Patient identity confirmed:  Verbally with patient Anesthesia:    Anesthesia method:  None Laceration details:    Location:  Face   Face location:  Forehead   Length (cm):  0.8   Depth (mm):  1 Exploration:    Hemostasis achieved with:  Direct pressure   Contaminated: no   Treatment:    Area cleansed with:  Saline   Amount of  cleaning:  Standard   Irrigation solution:  Sterile saline   Irrigation method:  Tap   Visualized foreign bodies/material removed: no   Skin repair:    Repair method: Dermaclip. Approximation:    Approximation:  Close Repair type:    Repair type:  Simple Post-procedure details:    Procedure completion:  Tolerated well, no immediate complications   ____________________________________________   INITIAL IMPRESSION / ASSESSMENT AND PLAN / ED COURSE  As part of my medical decision making, I reviewed the following data within the [place] notes reviewed and incorporated, Labs reviewed, Radiograph reviewed, and Notes from prior ED visits     85 year old female presenting status post unwitnessed fall with facial laceration.  Also noted to have diarrhea.  Differential diagnosis includes but is not limited to [place], cervical spine injury, infectious, metabolic  etiologies, etc.  CT imaging results unremarkable.  Lab work normal.  Awaiting UA.  Facial laceration repaired with single derma clip.  Clinical Course as of 07/28/21 2130  Nancy Fetter Jul 28, 2021  0422 Urine via [employer]; will send to lab.  Patient resting in no acute distress. [JS]  0547 UA nitrite and LE+ UTI. Will place on Keflex and patient will follow up with her PCP. Strict return precautions given. Patient verbalizes understanding and agrees with plan of care. [JS]    Clinical Course User Index [JS] Paulette Blanch, MD     ____________________________________________   FINAL CLINICAL IMPRESSION(S) / ED DIAGNOSES  Final diagnoses:  Fall, initial encounter  Injury of head, initial encounter  Facial laceration, initial encounter  Urinary tract infection without hematuria, site unspecified     ED Discharge Orders          Ordered    cephALEXin (KEFLEX) 250 MG capsule  3 times daily        07/28/21 0548             Note:  This document was prepared using Dragon voice recognition software and may include unintentional dictation errors.    Paulette Blanch, MD 07/28/21 [phone]

## 2021-07-28 NOTE — ED Notes (Signed)
Pt provided juice. Attempting to get out of bed, redirected. Tv turned on Bed alarm in place

## 2021-08-08 DIAGNOSIS — R451 Restlessness and agitation: Secondary | ICD-10-CM | POA: Diagnosis not present

## 2021-08-08 DIAGNOSIS — G4700 Insomnia, unspecified: Secondary | ICD-10-CM | POA: Diagnosis not present

## 2021-08-08 DIAGNOSIS — R600 Localized edema: Secondary | ICD-10-CM | POA: Diagnosis not present

## 2021-08-08 DIAGNOSIS — N390 Urinary tract infection, site not specified: Secondary | ICD-10-CM | POA: Diagnosis not present

## 2021-08-08 DIAGNOSIS — I10 Essential (primary) hypertension: Secondary | ICD-10-CM | POA: Diagnosis not present

## 2021-08-08 DIAGNOSIS — I4891 Unspecified atrial fibrillation: Secondary | ICD-10-CM | POA: Diagnosis not present

## 2021-08-08 DIAGNOSIS — G309 Alzheimer's disease, unspecified: Secondary | ICD-10-CM | POA: Diagnosis not present

## 2021-08-23 DIAGNOSIS — F32A Depression, unspecified: Secondary | ICD-10-CM | POA: Diagnosis not present

## 2021-08-23 DIAGNOSIS — F03918 Unspecified dementia, unspecified severity, with other behavioral disturbance: Secondary | ICD-10-CM | POA: Diagnosis not present

## 2021-08-27 DIAGNOSIS — M79674 Pain in right toe(s): Secondary | ICD-10-CM | POA: Diagnosis not present

## 2021-08-27 DIAGNOSIS — M2041 Other hammer toe(s) (acquired), right foot: Secondary | ICD-10-CM | POA: Diagnosis not present

## 2021-08-27 DIAGNOSIS — M2042 Other hammer toe(s) (acquired), left foot: Secondary | ICD-10-CM | POA: Diagnosis not present

## 2021-08-27 DIAGNOSIS — M79675 Pain in left toe(s): Secondary | ICD-10-CM | POA: Diagnosis not present

## 2021-08-27 DIAGNOSIS — B351 Tinea unguium: Secondary | ICD-10-CM | POA: Diagnosis not present

## 2021-08-27 DIAGNOSIS — M2011 Hallux valgus (acquired), right foot: Secondary | ICD-10-CM | POA: Diagnosis not present

## 2021-08-27 DIAGNOSIS — M2012 Hallux valgus (acquired), left foot: Secondary | ICD-10-CM | POA: Diagnosis not present

## 2021-08-30 DIAGNOSIS — I4891 Unspecified atrial fibrillation: Secondary | ICD-10-CM | POA: Diagnosis not present

## 2021-08-30 DIAGNOSIS — E038 Other specified hypothyroidism: Secondary | ICD-10-CM | POA: Diagnosis not present

## 2021-08-30 DIAGNOSIS — D518 Other vitamin B12 deficiency anemias: Secondary | ICD-10-CM | POA: Diagnosis not present

## 2021-08-30 DIAGNOSIS — I10 Essential (primary) hypertension: Secondary | ICD-10-CM | POA: Diagnosis not present

## 2021-08-30 DIAGNOSIS — E559 Vitamin D deficiency, unspecified: Secondary | ICD-10-CM | POA: Diagnosis not present

## 2021-08-30 DIAGNOSIS — E119 Type 2 diabetes mellitus without complications: Secondary | ICD-10-CM | POA: Diagnosis not present

## 2021-08-30 DIAGNOSIS — G309 Alzheimer's disease, unspecified: Secondary | ICD-10-CM | POA: Diagnosis not present

## 2021-09-05 DIAGNOSIS — G4700 Insomnia, unspecified: Secondary | ICD-10-CM | POA: Diagnosis not present

## 2021-09-05 DIAGNOSIS — I10 Essential (primary) hypertension: Secondary | ICD-10-CM | POA: Diagnosis not present

## 2021-09-05 DIAGNOSIS — I4891 Unspecified atrial fibrillation: Secondary | ICD-10-CM | POA: Diagnosis not present

## 2021-09-05 DIAGNOSIS — R451 Restlessness and agitation: Secondary | ICD-10-CM | POA: Diagnosis not present

## 2021-09-05 DIAGNOSIS — G309 Alzheimer's disease, unspecified: Secondary | ICD-10-CM | POA: Diagnosis not present

## 2021-09-09 ENCOUNTER — Emergency Department
Admission: EM | Admit: 2021-09-09 | Discharge: 2021-09-09 | Disposition: A | Discharge status: Home / Self Care | Payer: PPO | Attending: Emergency Medicine | Admitting: Emergency Medicine

## 2021-09-09 ENCOUNTER — Emergency Department: Payer: PPO

## 2021-09-09 ENCOUNTER — Encounter: Payer: Self-pay | Admitting: *Deleted

## 2021-09-09 ENCOUNTER — Other Ambulatory Visit: Payer: Self-pay

## 2021-09-09 DIAGNOSIS — S0990XA Unspecified injury of head, initial encounter: Secondary | ICD-10-CM | POA: Insufficient documentation

## 2021-09-09 DIAGNOSIS — I4891 Unspecified atrial fibrillation: Secondary | ICD-10-CM | POA: Diagnosis not present

## 2021-09-09 DIAGNOSIS — M4319 Spondylolisthesis, multiple sites in spine: Secondary | ICD-10-CM | POA: Diagnosis not present

## 2021-09-09 DIAGNOSIS — F0390 Unspecified dementia without behavioral disturbance: Secondary | ICD-10-CM | POA: Insufficient documentation

## 2021-09-09 DIAGNOSIS — Z20822 Contact with and (suspected) exposure to covid-19: Secondary | ICD-10-CM | POA: Insufficient documentation

## 2021-09-09 DIAGNOSIS — W19XXXA Unspecified fall, initial encounter: Secondary | ICD-10-CM | POA: Diagnosis not present

## 2021-09-09 DIAGNOSIS — N390 Urinary tract infection, site not specified: Secondary | ICD-10-CM | POA: Diagnosis not present

## 2021-09-09 DIAGNOSIS — W01198A Fall on same level from slipping, tripping and stumbling with subsequent striking against other object, initial encounter: Secondary | ICD-10-CM | POA: Insufficient documentation

## 2021-09-09 DIAGNOSIS — I2510 Atherosclerotic heart disease of native coronary artery without angina pectoris: Secondary | ICD-10-CM | POA: Diagnosis not present

## 2021-09-09 DIAGNOSIS — I10 Essential (primary) hypertension: Secondary | ICD-10-CM | POA: Diagnosis not present

## 2021-09-09 DIAGNOSIS — R0602 Shortness of breath: Secondary | ICD-10-CM | POA: Diagnosis not present

## 2021-09-09 DIAGNOSIS — S199XXA Unspecified injury of neck, initial encounter: Secondary | ICD-10-CM | POA: Diagnosis not present

## 2021-09-09 DIAGNOSIS — I517 Cardiomegaly: Secondary | ICD-10-CM | POA: Diagnosis not present

## 2021-09-09 DIAGNOSIS — M5030 Other cervical disc degeneration, unspecified cervical region: Secondary | ICD-10-CM | POA: Diagnosis not present

## 2021-09-09 LAB — URINALYSIS, COMPLETE (UACMP) WITH MICROSCOPIC
Bacteria, UA: NONE SEEN
Bilirubin Urine: NEGATIVE
Glucose, UA: NEGATIVE mg/dL
Hgb urine dipstick: NEGATIVE
Ketones, ur: NEGATIVE mg/dL
Nitrite: NEGATIVE
Protein, ur: NEGATIVE mg/dL
Specific Gravity, Urine: 1.01 (ref 1.005–1.030)
pH: 5.5 (ref 5.0–8.0)

## 2021-09-09 LAB — COMPREHENSIVE METABOLIC PANEL WITH GFR
ALT: 16 U/L (ref 0–44)
AST: 24 U/L (ref 15–41)
Albumin: 3.5 g/dL (ref 3.5–5.0)
Alkaline Phosphatase: 75 U/L (ref 38–126)
Anion gap: 3 — ABNORMAL LOW (ref 5–15)
BUN: 22 mg/dL (ref 8–23)
CO2: 29 mmol/L (ref 22–32)
Calcium: 9.3 mg/dL (ref 8.9–10.3)
Chloride: 105 mmol/L (ref 98–111)
Creatinine, Ser: 1.05 mg/dL — ABNORMAL HIGH (ref 0.44–1.00)
GFR, Estimated: 53 mL/min — ABNORMAL LOW
Glucose, Bld: 72 mg/dL (ref 70–99)
Potassium: 3.7 mmol/L (ref 3.5–5.1)
Sodium: 137 mmol/L (ref 135–145)
Total Bilirubin: 0.5 mg/dL (ref 0.3–1.2)
Total Protein: 6.6 g/dL (ref 6.5–8.1)

## 2021-09-09 LAB — LACTIC ACID, PLASMA: Lactic Acid, Venous: 1.6 mmol/L (ref 0.5–1.9)

## 2021-09-09 LAB — CBC WITH DIFFERENTIAL/PLATELET
Abs Immature Granulocytes: 0.02 10*3/uL (ref 0.00–0.07)
Basophils Absolute: 0 10*3/uL (ref 0.0–0.1)
Basophils Relative: 1 %
Eosinophils Absolute: 0.1 10*3/uL (ref 0.0–0.5)
Eosinophils Relative: 2 %
HCT: 47.1 % — ABNORMAL HIGH (ref 36.0–46.0)
Hemoglobin: 15.1 g/dL — ABNORMAL HIGH (ref 12.0–15.0)
Immature Granulocytes: 0 %
Lymphocytes Relative: 24 %
Lymphs Abs: 1.4 10*3/uL (ref 0.7–4.0)
MCH: 30.4 pg (ref 26.0–34.0)
MCHC: 32.1 g/dL (ref 30.0–36.0)
MCV: 95 fL (ref 80.0–100.0)
Monocytes Absolute: 0.4 10*3/uL (ref 0.1–1.0)
Monocytes Relative: 7 %
Neutro Abs: 3.8 10*3/uL (ref 1.7–7.7)
Neutrophils Relative %: 66 %
Platelets: 305 10*3/uL (ref 150–400)
RBC: 4.96 MIL/uL (ref 3.87–5.11)
RDW: 13.8 % (ref 11.5–15.5)
WBC: 5.8 10*3/uL (ref 4.0–10.5)
nRBC: 0 % (ref 0.0–0.2)

## 2021-09-09 LAB — RESP PANEL BY RT-PCR (FLU A&B, COVID) ARPGX2
Influenza A by PCR: NEGATIVE
Influenza B by PCR: NEGATIVE
SARS Coronavirus 2 by RT PCR: NEGATIVE

## 2021-09-09 LAB — TROPONIN I (HIGH SENSITIVITY): Troponin I (High Sensitivity): 8 ng/L

## 2021-09-09 MED ORDER — NITROFURANTOIN MONOHYD MACRO 100 MG PO CAPS
100.0000 mg | ORAL_CAPSULE | Freq: Once | ORAL | Status: AC
Start: 2021-09-09 — End: 2021-09-09
  Administered 2021-09-09: 100 mg via ORAL
  Filled 2021-09-09: qty 1

## 2021-09-09 MED ORDER — SODIUM CHLORIDE 0.9 % IV BOLUS
1000.0000 mL | Freq: Once | INTRAVENOUS | Status: AC
  Administered 2021-09-09: 1000 mL via INTRAVENOUS

## 2021-09-09 MED ORDER — NITROFURANTOIN MONOHYD MACRO 100 MG PO CAPS: 100.0000 mg | ORAL_CAPSULE | Freq: Two times a day (BID) | ORAL | 0 refills | Status: AC

## 2021-09-09 MED ORDER — CEPHALEXIN 500 MG PO CAPS: 500.0000 mg | ORAL_CAPSULE | Freq: Once | ORAL | Status: DC

## 2021-09-09 NOTE — ED Notes (Signed)
EDP in to see at [facility]

## 2021-09-09 NOTE — ED Notes (Signed)
Son contacted for ride home, on his way

## 2021-09-09 NOTE — ED Notes (Signed)
To CT via stretcher. No changes. Alert, NAD, calm, interactive, follows commands.

## 2021-09-09 NOTE — ED Provider Notes (Signed)
[facility] Provider Note    Event Date/Time   First MD Initiated Contact with Patient 09/09/21 [phone]     (approximate)   History   Fall   HPI  Amy Stanley is a 86 y.o. female with past medical history of atrial fibrillation, depression, hypertension and hyperlipidemia who presents after a fall.  Patient was at [facility] where she stays in the memory care unit when she was witnessed to fall backward and hit her head.  Unclear if she had loss of consciousness.  Patient is not able to provide any history although she tells me she is not in pain.    Past Medical History:  Diagnosis Date   Atrial fibrillation ([place])    Depression    Hyperlipidemia    Hypertension     Patient Active Problem List   Diagnosis Date Noted   CAP (community acquired pneumonia) 01/17/2021   Severe sepsis ([place]) 01/17/2021   Elevated troponin 01/17/2021   AKI (acute kidney injury) ([place]) 01/17/2021   Mild concentric left ventricular hypertrophy (LVH) 11/01/2020   Moderate aortic regurgitation 11/01/2020   Atrial fibrillation with rapid ventricular response ([place]) 10/06/2020   Acute lower UTI 10/06/2020   Syncope and collapse 10/06/2020   Dementia ([place]) 10/06/2020   Hearing loss 09/25/2020   Decreased appetite 09/25/2020   Sundowning 09/25/2020   Diarrhea 04/10/2020   Loss of memory 09/09/2018   Depression, prolonged 08/27/2018   Insomnia 08/27/2018   Bradycardia 01/29/2017   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      Excl. in [place]?     Most recent vital signs: Vitals:   09/09/21 1115 09/09/21 1155  BP: 99/70 121/71  Pulse: (!) 53   Resp: 17    Temp: 97.9 F (36.6 C)   SpO2: 99%      General: Awake, no distress.  CV:  Good peripheral perfusion.  No lower extremity edema Resp:  Normal effort.  No chest wall tenderness Abd:  No distention.  Soft and nontender throughout Neuro:             Awake, oriented to person only, moves all extremities symmetrically Other:     ED Results / Procedures / Treatments  Labs (all labs ordered are listed, but only abnormal results are displayed) Labs Reviewed  COMPREHENSIVE METABOLIC PANEL - Abnormal; Notable for the following components:      Result Value   Creatinine, Ser 1.05 (*)    GFR, Estimated 53 (*)    Anion gap 3 (*)    All other components within normal limits  CBC WITH DIFFERENTIAL/PLATELET - Abnormal; Notable for the following components:   Hemoglobin 15.1 (*)    HCT 47.1 (*)    All other components within normal limits  URINALYSIS, COMPLETE (UACMP) WITH MICROSCOPIC - Abnormal; Notable for the following components:   Leukocytes,Ua  SMALL (*)    All other components within normal limits  RESP PANEL BY RT-PCR (FLU A&B, COVID) [id number]  URINE CULTURE  LACTIC ACID, PLASMA  LACTIC ACID, PLASMA  TROPONIN I (HIGH SENSITIVITY)  TROPONIN I (HIGH SENSITIVITY)     EKG  EKG interpreted by myself, atrial fibrillation, rate controlled, right bundle branch block, left anterior fascicular block, no acute ischemic changes   RADIOLOGY I reviewed the CT scan of the brain which does not show any acute intracranial process; agree with radiology report   I reviewed the CT of the cervical spine which does not show any acute fracture or misalignment; agree with radiology report   I reviewed the CXR which does not show any acute cardiopulmonary process; agree with radiology report     PROCEDURES:  Critical Care performed: No  .1-3 Lead EKG Interpretation Performed by: Rada Hay, MD Authorized by: Rada Hay, MD     Interpretation: abnormal     ECG rate  assessment: normal     Rhythm: atrial fibrillation     Ectopy: none     Conduction: normal    The patient is on the cardiac monitor to evaluate for evidence of arrhythmia and/or significant heart rate changes.   MEDICATIONS ORDERED IN ED: Medications  nitrofurantoin (macrocrystal-monohydrate) (MACROBID) capsule 100 mg (has no administration in time range)  sodium chloride 0.9 % bolus 1,000 mL ( Intravenous Infusion Verify 09/09/21 1116)     IMPRESSION / MDM / ASSESSMENT AND PLAN / ED COURSE  I reviewed the triage vital signs and the nursing notes.                              Differential diagnosis includes, but is not limited to, sepsis, dehydration, acute anemia, ACS  The patient is an 86 year old female with dementia who was oriented to person only who presents after a fall.  Apparently this was witnessed and she did fall backward and hit her head.  Patient's been initial vital signs are notable for hypotension, systolics around 83.  Patient is awake and oriented to person without any signs of trauma.  She has no abdominal tenderness luminal extremity edema.  Given the hypotension will obtain screening labs and infectious work-up including chest x-ray and urine.  Will obtain a CT of the head and C-spine given the trauma.  We will give a fluid bolus.  Patient's blood pressure responded to 1 L of normal saline.  I reviewed her labs, she has no leukocytosis, no evidence of endorgan injury, lactate is normal.  Chest x-ray does not show any pneumonia.  CT head and C-spine are without traumatic pathology.  UA does have 21-50 WBCs, unclear if this is asymptomatic bacteriuria or not but will treat as an uncomplicated UTI.  Do not feel that patient is septic.  May have been somewhat volume down which contributed to her fall.  Ultimately given we have not identified sepsis and patient has no traumatic injuries and is now stabilized I think she is appropriate for outpatient management.         FINAL CLINICAL IMPRESSION(S) / ED DIAGNOSES   Final diagnoses:  Fall, initial encounter  Urinary tract infection without hematuria, site unspecified     Rx / DC Orders   ED Discharge Orders          Ordered    nitrofurantoin, macrocrystal-monohydrate, (MACROBID) 100 MG capsule  2 times daily  09/09/21 1208             Note:  This document was prepared using Dragon voice recognition software and may include unintentional dictation errors.   Rada Hay, MD 09/09/21 [phone]

## 2021-09-09 NOTE — ED Notes (Signed)
Please call pts son Ron at [phone] when pt is ready to go back to [facility]

## 2021-09-09 NOTE — ED Triage Notes (Signed)
[facility], from [facility] s/p witnessed fall, fell back and hit posterior head, no obvious injury, Low BP for staff and on arrival to ED, but normal BP for EMS, h/o afib, afib on monitor, not taking blood thinners, CBG 83, baseline dementia per facility. Denies pain. Son called and will pick up at time of d/c. Pt alert, NAD, calm, interactive. EDP at [facility] on arrival.

## 2021-09-09 NOTE — Discharge Instructions (Addendum)
Patient's blood pressure was low in the emergency department but improved with fluids.  Please make sure she is eating and drinking enough.  She may have a urinary tract infection.  We have prescribed an antibiotic which she should take twice a day for the next 3 days.  The CT of her head and neck did not show any acute traumatic injury.

## 2021-09-12 LAB — URINE CULTURE: Culture: >=100000 — AB

## 2021-09-13 DIAGNOSIS — F32A Depression, unspecified: Secondary | ICD-10-CM | POA: Diagnosis not present

## 2021-09-13 DIAGNOSIS — F03918 Unspecified dementia, unspecified severity, with other behavioral disturbance: Secondary | ICD-10-CM | POA: Diagnosis not present

## 2021-09-19 DIAGNOSIS — I10 Essential (primary) hypertension: Secondary | ICD-10-CM | POA: Diagnosis not present

## 2021-09-19 DIAGNOSIS — R600 Localized edema: Secondary | ICD-10-CM | POA: Diagnosis not present

## 2021-09-19 DIAGNOSIS — G309 Alzheimer's disease, unspecified: Secondary | ICD-10-CM | POA: Diagnosis not present

## 2021-09-19 DIAGNOSIS — G4700 Insomnia, unspecified: Secondary | ICD-10-CM | POA: Diagnosis not present

## 2021-09-19 DIAGNOSIS — R4689 Other symptoms and signs involving appearance and behavior: Secondary | ICD-10-CM | POA: Diagnosis not present

## 2021-09-19 DIAGNOSIS — N390 Urinary tract infection, site not specified: Secondary | ICD-10-CM | POA: Diagnosis not present

## 2021-09-25 DIAGNOSIS — I10 Essential (primary) hypertension: Secondary | ICD-10-CM | POA: Diagnosis not present

## 2021-09-25 DIAGNOSIS — E119 Type 2 diabetes mellitus without complications: Secondary | ICD-10-CM | POA: Diagnosis not present

## 2021-09-25 DIAGNOSIS — G309 Alzheimer's disease, unspecified: Secondary | ICD-10-CM | POA: Diagnosis not present

## 2021-09-25 DIAGNOSIS — D518 Other vitamin B12 deficiency anemias: Secondary | ICD-10-CM | POA: Diagnosis not present

## 2021-09-25 DIAGNOSIS — E559 Vitamin D deficiency, unspecified: Secondary | ICD-10-CM | POA: Diagnosis not present

## 2021-09-25 DIAGNOSIS — I4891 Unspecified atrial fibrillation: Secondary | ICD-10-CM | POA: Diagnosis not present

## 2021-09-25 DIAGNOSIS — E038 Other specified hypothyroidism: Secondary | ICD-10-CM | POA: Diagnosis not present

## 2021-10-09 DIAGNOSIS — D518 Other vitamin B12 deficiency anemias: Secondary | ICD-10-CM | POA: Diagnosis not present

## 2021-10-09 DIAGNOSIS — E038 Other specified hypothyroidism: Secondary | ICD-10-CM | POA: Diagnosis not present

## 2021-10-09 DIAGNOSIS — E559 Vitamin D deficiency, unspecified: Secondary | ICD-10-CM | POA: Diagnosis not present

## 2021-10-09 DIAGNOSIS — Z79899 Other long term (current) drug therapy: Secondary | ICD-10-CM | POA: Diagnosis not present

## 2021-10-09 DIAGNOSIS — E119 Type 2 diabetes mellitus without complications: Secondary | ICD-10-CM | POA: Diagnosis not present

## 2021-10-09 DIAGNOSIS — E7849 Other hyperlipidemia: Secondary | ICD-10-CM | POA: Diagnosis not present

## 2021-10-17 DIAGNOSIS — R296 Repeated falls: Secondary | ICD-10-CM | POA: Diagnosis not present

## 2021-10-17 DIAGNOSIS — I10 Essential (primary) hypertension: Secondary | ICD-10-CM | POA: Diagnosis not present

## 2021-10-17 DIAGNOSIS — R600 Localized edema: Secondary | ICD-10-CM | POA: Diagnosis not present

## 2021-10-17 DIAGNOSIS — R451 Restlessness and agitation: Secondary | ICD-10-CM | POA: Diagnosis not present

## 2021-10-17 DIAGNOSIS — G4700 Insomnia, unspecified: Secondary | ICD-10-CM | POA: Diagnosis not present

## 2021-11-01 DIAGNOSIS — F03918 Unspecified dementia, unspecified severity, with other behavioral disturbance: Secondary | ICD-10-CM | POA: Diagnosis not present

## 2021-11-01 DIAGNOSIS — F32A Depression, unspecified: Secondary | ICD-10-CM | POA: Diagnosis not present

## 2021-11-22 DIAGNOSIS — F59 Unspecified behavioral syndromes associated with physiological disturbances and physical factors: Secondary | ICD-10-CM | POA: Diagnosis not present

## 2021-11-22 DIAGNOSIS — F03918 Unspecified dementia, unspecified severity, with other behavioral disturbance: Secondary | ICD-10-CM | POA: Diagnosis not present

## 2021-11-22 DIAGNOSIS — F32A Depression, unspecified: Secondary | ICD-10-CM | POA: Diagnosis not present

## 2021-12-20 DIAGNOSIS — F03918 Unspecified dementia, unspecified severity, with other behavioral disturbance: Secondary | ICD-10-CM | POA: Diagnosis not present

## 2021-12-20 DIAGNOSIS — F59 Unspecified behavioral syndromes associated with physiological disturbances and physical factors: Secondary | ICD-10-CM | POA: Diagnosis not present

## 2021-12-20 DIAGNOSIS — F32A Depression, unspecified: Secondary | ICD-10-CM | POA: Diagnosis not present

## 2022-01-10 DIAGNOSIS — F32A Depression, unspecified: Secondary | ICD-10-CM | POA: Diagnosis not present

## 2022-01-10 DIAGNOSIS — F59 Unspecified behavioral syndromes associated with physiological disturbances and physical factors: Secondary | ICD-10-CM | POA: Diagnosis not present

## 2022-01-10 DIAGNOSIS — F03918 Unspecified dementia, unspecified severity, with other behavioral disturbance: Secondary | ICD-10-CM | POA: Diagnosis not present

## 2022-01-18 ENCOUNTER — Other Ambulatory Visit: Payer: Self-pay

## 2022-01-18 ENCOUNTER — Emergency Department

## 2022-01-18 ENCOUNTER — Emergency Department
Admission: EM | Admit: 2022-01-18 | Discharge: 2022-01-18 | Disposition: A | Discharge status: Home / Self Care | Attending: Emergency Medicine | Admitting: Emergency Medicine

## 2022-01-18 DIAGNOSIS — S0990XA Unspecified injury of head, initial encounter: Secondary | ICD-10-CM | POA: Diagnosis not present

## 2022-01-18 DIAGNOSIS — F0390 Unspecified dementia without behavioral disturbance: Secondary | ICD-10-CM | POA: Diagnosis not present

## 2022-01-18 DIAGNOSIS — W19XXXA Unspecified fall, initial encounter: Secondary | ICD-10-CM | POA: Diagnosis not present

## 2022-01-18 DIAGNOSIS — R0902 Hypoxemia: Secondary | ICD-10-CM | POA: Diagnosis not present

## 2022-01-18 DIAGNOSIS — M4312 Spondylolisthesis, cervical region: Secondary | ICD-10-CM | POA: Diagnosis not present

## 2022-01-18 DIAGNOSIS — R001 Bradycardia, unspecified: Secondary | ICD-10-CM | POA: Diagnosis not present

## 2022-01-18 LAB — CBC WITH DIFFERENTIAL/PLATELET
Abs Immature Granulocytes: 0.01 10*3/uL (ref 0.00–0.07)
Basophils Absolute: 0 10*3/uL (ref 0.0–0.1)
Basophils Relative: 0 %
Eosinophils Absolute: 0.1 10*3/uL (ref 0.0–0.5)
Eosinophils Relative: 2 %
HCT: 44.6 % (ref 36.0–46.0)
Hemoglobin: 14.1 g/dL (ref 12.0–15.0)
Immature Granulocytes: 0 %
Lymphocytes Relative: 26 %
Lymphs Abs: 1.5 10*3/uL (ref 0.7–4.0)
MCH: 30 pg (ref 26.0–34.0)
MCHC: 31.6 g/dL (ref 30.0–36.0)
MCV: 94.9 fL (ref 80.0–100.0)
Monocytes Absolute: 0.6 10*3/uL (ref 0.1–1.0)
Monocytes Relative: 10 %
Neutro Abs: 3.5 10*3/uL (ref 1.7–7.7)
Neutrophils Relative %: 62 %
Platelets: 211 10*3/uL (ref 150–400)
RBC: 4.7 MIL/uL (ref 3.87–5.11)
RDW: 14.6 % (ref 11.5–15.5)
WBC: 5.7 10*3/uL (ref 4.0–10.5)
nRBC: 0 % (ref 0.0–0.2)

## 2022-01-18 LAB — COMPREHENSIVE METABOLIC PANEL
ALT: 16 U/L (ref 0–44)
AST: 20 U/L (ref 15–41)
Albumin: 3.8 g/dL (ref 3.5–5.0)
Alkaline Phosphatase: 79 U/L (ref 38–126)
Anion gap: 4 — ABNORMAL LOW (ref 5–15)
BUN: 20 mg/dL (ref 8–23)
CO2: 27 mmol/L (ref 22–32)
Calcium: 9.9 mg/dL (ref 8.9–10.3)
Chloride: 107 mmol/L (ref 98–111)
Creatinine, Ser: 0.61 mg/dL (ref 0.44–1.00)
GFR, Estimated: >60 mL/min (ref >60)
Glucose, Bld: 78 mg/dL (ref 70–99)
Potassium: 4.4 mmol/L (ref 3.5–5.1)
Sodium: 138 mmol/L (ref 135–145)
Total Bilirubin: 0.7 mg/dL (ref 0.3–1.2)
Total Protein: 7 g/dL (ref 6.5–8.1)

## 2022-01-18 LAB — TROPONIN I (HIGH SENSITIVITY): Troponin I (High Sensitivity): 6 ng/L (ref <18)

## 2022-01-18 NOTE — ED Notes (Signed)
Prior to discharge, pt's brief changed, peri care performed.  Pt. Dried, brief applied. Pt's son states he will notify staff at [facility] that her pants need to be changed when she gets back.

## 2022-01-18 NOTE — ED Notes (Signed)
Son called if any info needed please call and he also will take patient back to [facility] [phone]

## 2022-01-18 NOTE — ED Notes (Signed)
This RN contacted pt's son, Garnet Chatmon, for discharge and transport. Ron states he will be there to pick his mother up in approx. 85mns. Pt. Updated.

## 2022-01-18 NOTE — ED Triage Notes (Signed)
BIB from [facility] from [facility] for unwitnessed fall. Staff at facility advised she hit her head and want her evaluated.  Vitals WNL.

## 2022-01-18 NOTE — ED Provider Notes (Signed)
[facility] Provider Note   Event Date/Time   First MD Initiated Contact with Patient 01/18/22 [phone]     (approximate) History  Fall  HPI Amy Stanley is a 86 y.o. female with past medical history of severe dementia who presents from her skilled nursing facility after a witnessed fall back from standing striking her head but without any loss of consciousness.  Patient is not on any blood thinning medications.  Further history and review of systems are unable to obtain at this time as patient is minimally verbal at baseline and is only oriented to self occasionally.    Physical Exam  Triage Vital Signs: ED Triage Vitals  Enc Vitals Group     BP 01/18/22 0954 (!) 104/55     Pulse Rate 01/18/22 0950 (!) 58     Resp 01/18/22 0950 11     Temp 01/18/22 0950 98 F (36.7 C)     Temp Source 01/18/22 0950 Oral     SpO2 01/18/22 0950 99 %     Weight 01/18/22 0952 134 lb 14.7 oz (61.2 kg)     Height 01/18/22 0952 '5\' 1"'$  (1.549 m)     Head Circumference --      Peak Flow --      Pain Score 01/18/22 0951 0     Pain Loc --      Pain Edu? --      Excl. in [place]? --    Most recent vital signs: Vitals:   01/18/22 1145 01/18/22 1200  BP:  (!) 124/96  Pulse:  65  Resp: 15 16  Temp:    SpO2:  100%   General: Awake, cooperative CV:  Good peripheral perfusion.  Resp:  Normal effort.  Abd:  No distention.  Other:  Elderly overweight Caucasian female laying in bed in no acute distress.  No head trauma appreciated ED Results / Procedures / Treatments  Labs (all labs ordered are listed, but only abnormal results are displayed) Labs Reviewed  COMPREHENSIVE METABOLIC PANEL - Abnormal; Notable for the following components:      Result Value   Anion gap 4 (*)    All other components within normal limits  CBC WITH DIFFERENTIAL/PLATELET  TROPONIN I (HIGH SENSITIVITY)   RADIOLOGY ED MD interpretation: CT of the head without contrast interpreted by me shows no evidence  of acute abnormalities including no intracerebral hemorrhage, obvious masses, or significant edema  CT of the cervical spine interpreted by me does not show any evidence of acute abnormalities including no acute fracture, malalignment, height loss, or dislocation -Agree with radiology assessment Official radiology report(s): No results found. PROCEDURES: Critical Care performed: No .1-3 Lead EKG Interpretation  Performed by: Naaman Plummer, MD Authorized by: Naaman Plummer, MD     Interpretation: normal     ECG rate:  64   ECG rate assessment: normal     Rhythm: sinus rhythm     Ectopy: none     Conduction: normal    MEDICATIONS ORDERED IN ED: Medications - No data to display IMPRESSION / MDM / [place] / ED COURSE  I reviewed the triage vital signs and the nursing notes.                             The patient is on the cardiac monitor to evaluate for evidence of arrhythmia and/or significant heart rate changes. Patient's presentation is most  consistent with acute presentation with potential threat to life or bodily function.  Patient presenting with head trauma.  Patient's neurological exam was non-focal and unremarkable.  [place] Head CT Rule was applied and patient did not fall into the low risk category so a head CT was obtained.  This showed no significant findings.  At this time, it is felt that the most likely explanation for the patient's symptoms is concussion.   I also considered SAH, SDH, Epidural Hematoma, IPH, skull fracture, migraine but this appears less likely considering the data gathered thus far.   Patient provided neck CT as well as did not show any evidence of acute findings.   Patient remained stable and neurologically intact while in the emergency department.  Discussed warning signs that would prompt return to ED.  Head trauma handout was provided.  Discussed in detail concussion management.  No sports or strenuous activity until symptoms free.   Return to emergency department urgently if new or worsening symptoms develop.    Impression:  Concussion Fall from standing  Plan  Discharge from ED Tylenol for pain control. Avoid aspirin, NSAIDs, or other blood thinners. Advised patient on supportive measures for cognitive rest - avoid use of cognitive function for at least 24 hours.  This means no tv, books, texting, computers, etc. Limit visitors to the house.  Head trauma instructions provided in discharge instructions Instructed Pt to monitor for neurologic symptoms, severe HA, change in mental status, seizures, loss of conciousness. Instructed Pt to f/up w/ PCP in 3-5 days or ETC should symptoms worsen or not improve. Pt verbally expressed understanding and all questions were addressed to Pt's satisfaction.   FINAL CLINICAL IMPRESSION(S) / ED DIAGNOSES   Final diagnoses:  Fall, initial encounter  Injury of head, initial encounter   Rx / DC Orders   ED Discharge Orders     None      Note:  This document was prepared using Dragon voice recognition software and may include unintentional dictation errors.   Naaman Plummer, MD 01/19/22 [phone]

## 2022-01-18 NOTE — ED Notes (Signed)
RN to bedside for bed alarm. Pt trying to get out of bed. Has removed all cardiac leads and BP cuff. This RN replaced them and redirected to stay in bed. Turned TV on.

## 2022-02-26 IMAGING — CT CT CERVICAL SPINE W/O CM
3 of 4 series · 13 of 33 positions shown, 16 images · non-contrast
Comparison: 07/28/2021

CLINICAL DATA: Witnessed fall. Head trauma, minor (Age >= 65y);
Neck trauma (Age >= 65y)



[Series 6: sagittal bone · sagittal · 0.34mm/px · 5 of 51 slices shown, 6 images]
[im 17/51  bone]
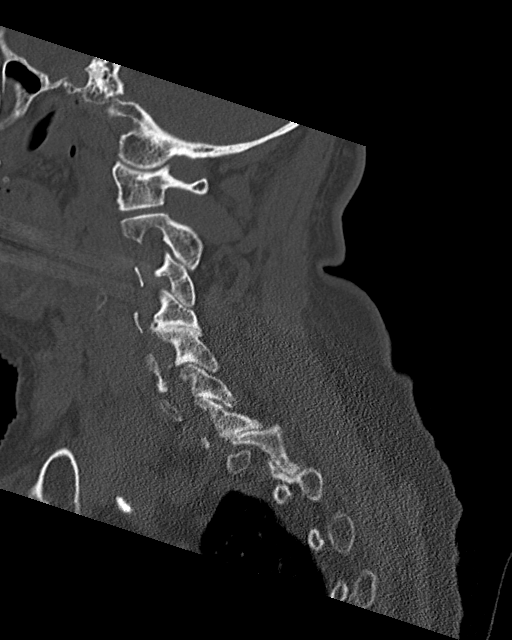
[im 21/51  bone]
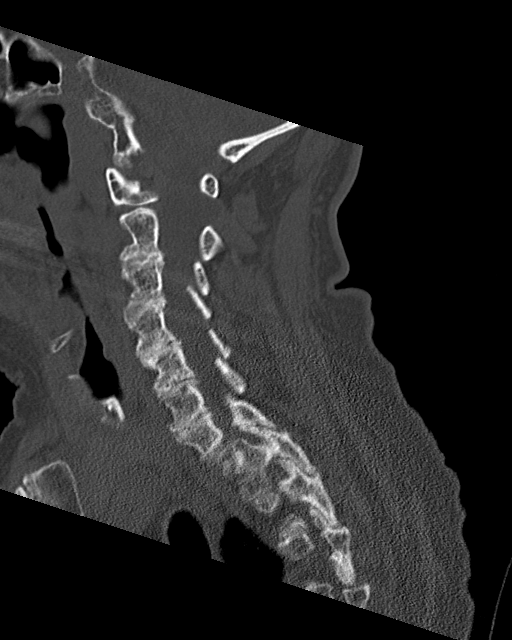
[im 26/51  soft-tissue]
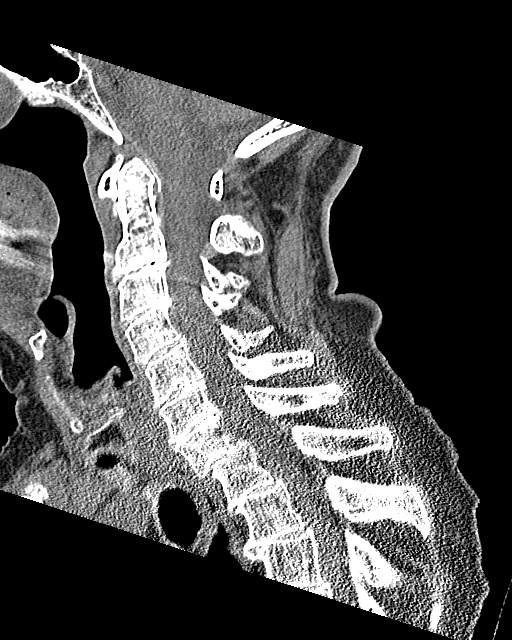
[im 26/51  bone]
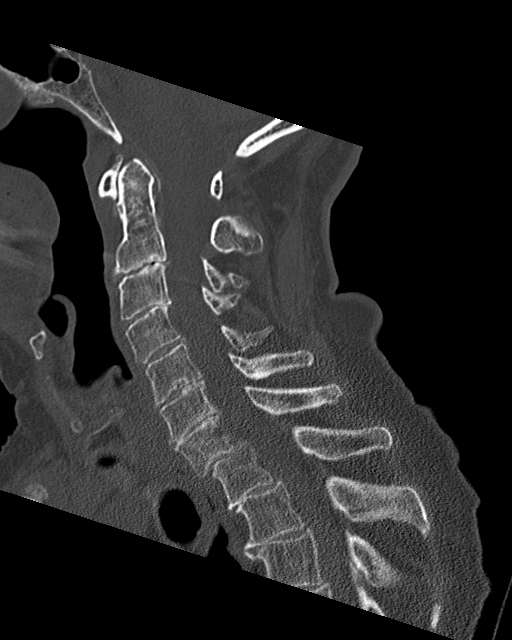
[im 30/51  bone]
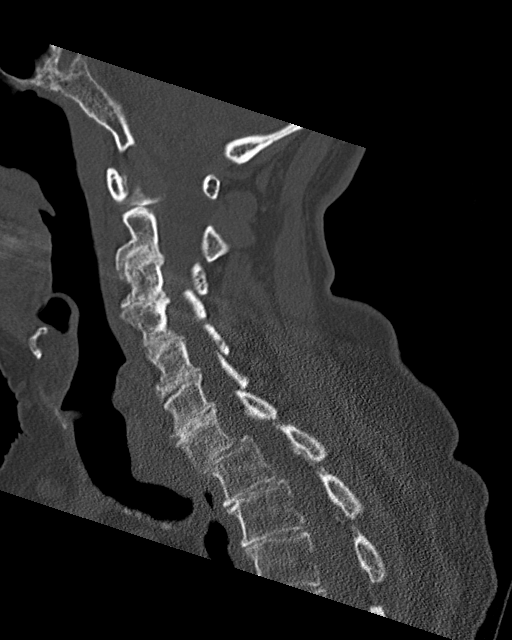
[im 34/51  bone]
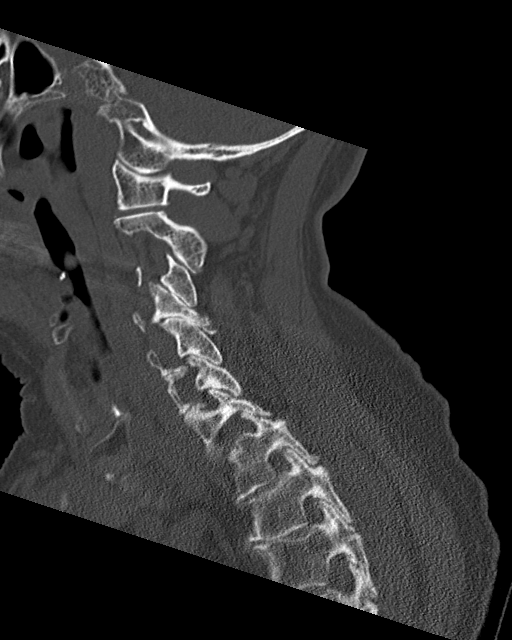

[Series 7: coronal bone · coronal · 0.34mm/px · 3 of 63 slices shown]
[im 14/63  bone]
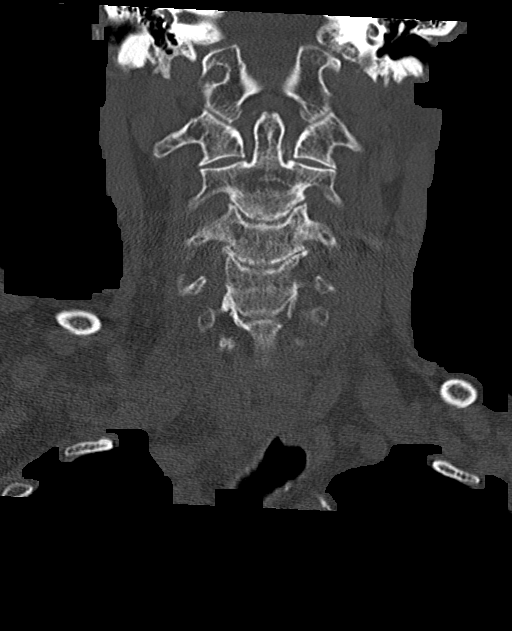
[im 26/63  bone]
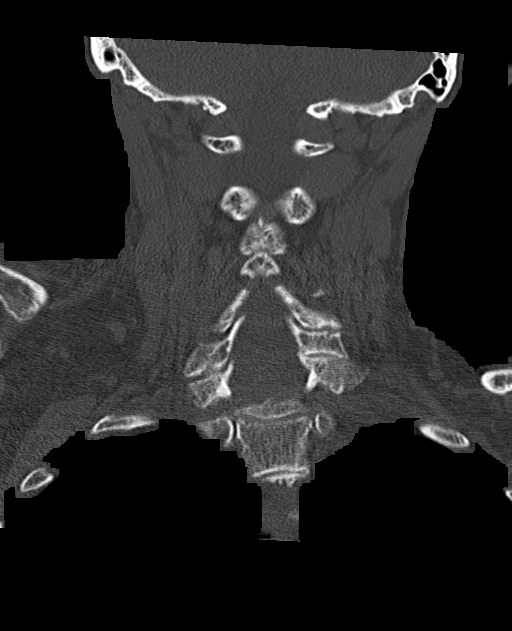
[im 37/63  bone]
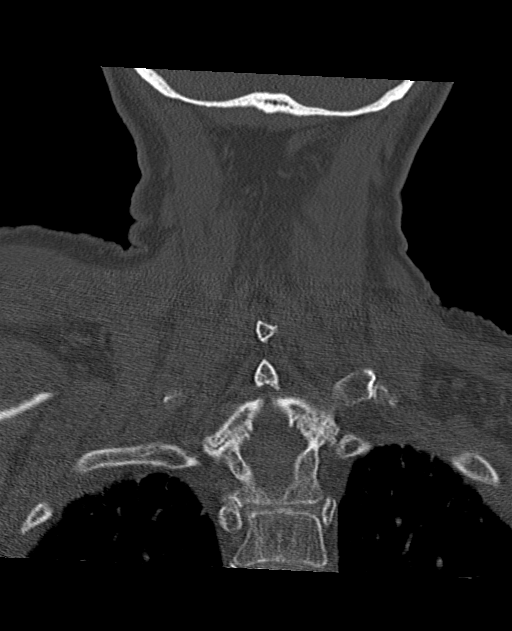

[Series 8: orthogonal bone · axial · 0.29mm/px · z∈[+147,+262]mm · 5 of 89 slices shown, 7 images]
[im 13/89  soft-tissue]
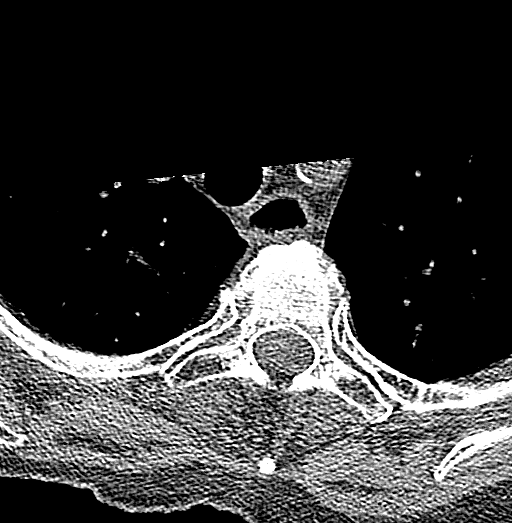
[im 13/89  bone]
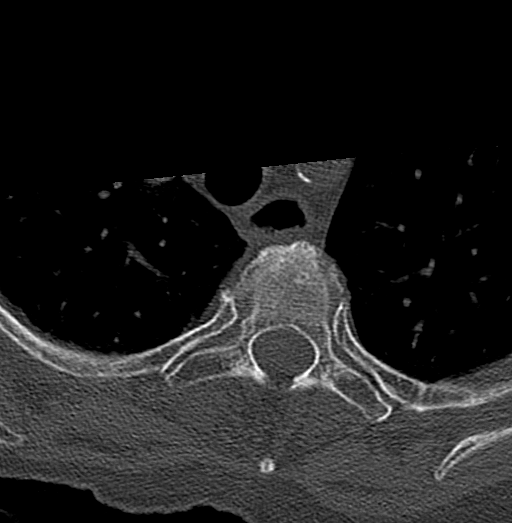
[im 26/89  bone]
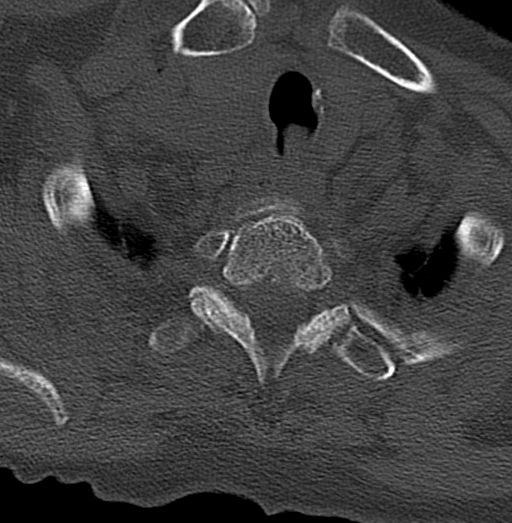
[im 51/89  bone]
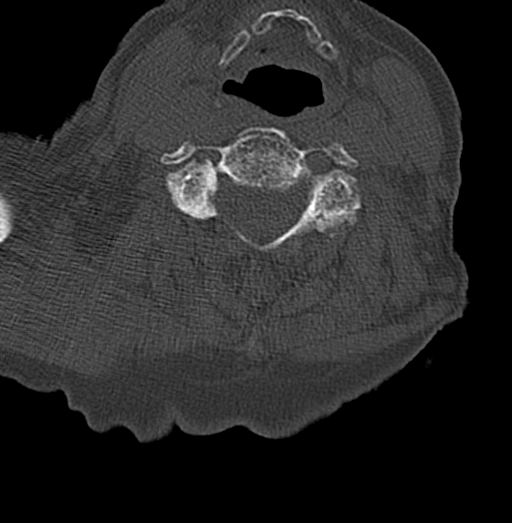
[im 63/89  bone]
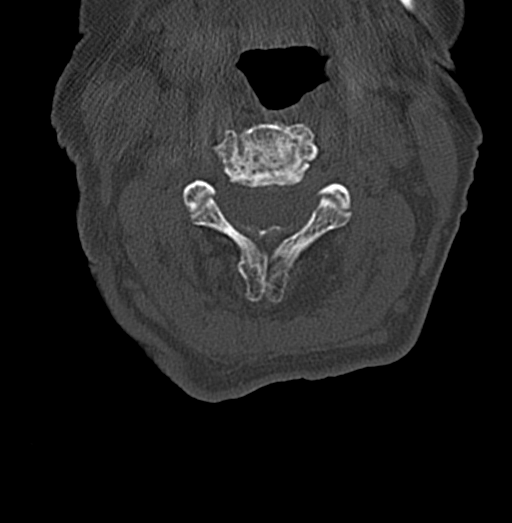
[im 76/89  soft-tissue]
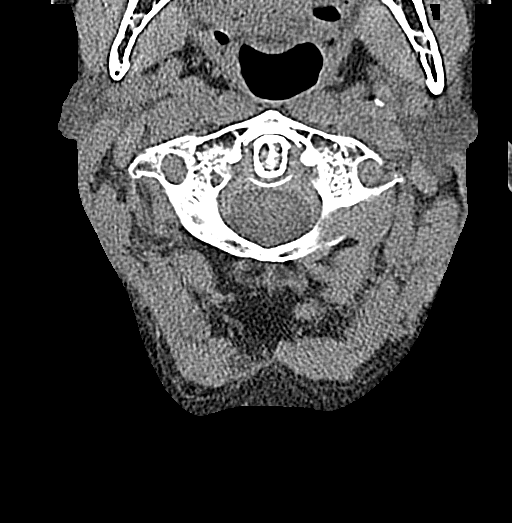
[im 76/89  bone]
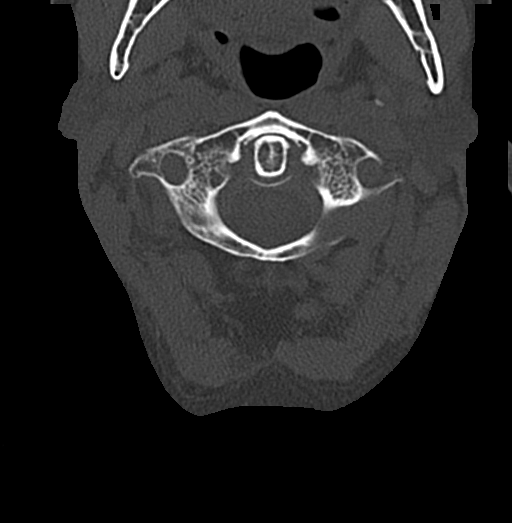

[13 of 33 positions shown; findings below may reference images not displayed]

FINDINGS: CT HEAD FINDINGS

Brain: No evidence of acute infarction, hemorrhage, hydrocephalus,
extra-axial collection or mass lesion/mass effect. Scattered
low-density changes within the periventricular and subcortical white
matter compatible with chronic microvascular ischemic change. Mild
diffuse cerebral volume loss.

Vascular: Atherosclerotic calcifications involving the large vessels
of the skull base. No unexpected hyperdense vessel.

Skull: Normal. Negative for fracture or focal lesion.

Sinuses/Orbits: No acute finding.

Other: Negative for scalp hematoma. Advanced degenerative changes of
the bilateral temporomandibular joints.

CT CERVICAL SPINE FINDINGS

Alignment: Facet joints are aligned without dislocation or traumatic
listhesis. Dens and lateral masses are aligned. Unchanged
anterolisthesis of C7 on T1 and T1 on T2 secondary to degenerative
facet arthropathy. Trace retrolisthesis at C3-4, also unchanged.

Skull base and vertebrae: No acute fracture. No primary bone lesion
or focal pathologic process.

Soft tissues and spinal canal: No prevertebral fluid or swelling. No
visible canal hematoma.

Disc levels: Similar degree of advanced multilevel degenerative disc
disease and facet arthropathy throughout the cervical spine and
visualized upper thoracic spine.

Upper chest: Included lung apices are clear.

Other: None.
IMPRESSION: 1. No acute intracranial abnormality.
2. No acute cervical spine fracture or traumatic subluxation.

## 2022-04-17 DIAGNOSIS — F59 Unspecified behavioral syndromes associated with physiological disturbances and physical factors: Secondary | ICD-10-CM | POA: Diagnosis not present

## 2022-04-17 DIAGNOSIS — F32A Depression, unspecified: Secondary | ICD-10-CM | POA: Diagnosis not present

## 2022-04-17 DIAGNOSIS — F03918 Unspecified dementia, unspecified severity, with other behavioral disturbance: Secondary | ICD-10-CM | POA: Diagnosis not present

## 2022-06-03 ENCOUNTER — Emergency Department
Admission: EM | Admit: 2022-06-03 | Discharge: 2022-06-04 | Disposition: A | Discharge status: Skilled Nursing Facility | Payer: Medicare (Managed Care) | Attending: Emergency Medicine | Admitting: Emergency Medicine

## 2022-06-03 ENCOUNTER — Other Ambulatory Visit: Payer: Self-pay

## 2022-06-03 ENCOUNTER — Emergency Department: Payer: Medicare (Managed Care)

## 2022-06-03 DIAGNOSIS — R220 Localized swelling, mass and lump, head: Secondary | ICD-10-CM | POA: Diagnosis not present

## 2022-06-03 DIAGNOSIS — R000 Tachycardia, unspecified: Secondary | ICD-10-CM | POA: Diagnosis not present

## 2022-06-03 DIAGNOSIS — R778 Other specified abnormalities of plasma proteins: Secondary | ICD-10-CM | POA: Insufficient documentation

## 2022-06-03 DIAGNOSIS — M47812 Spondylosis without myelopathy or radiculopathy, cervical region: Secondary | ICD-10-CM | POA: Diagnosis not present

## 2022-06-03 DIAGNOSIS — N390 Urinary tract infection, site not specified: Secondary | ICD-10-CM | POA: Diagnosis not present

## 2022-06-03 DIAGNOSIS — S0101XA Laceration without foreign body of scalp, initial encounter: Secondary | ICD-10-CM | POA: Diagnosis not present

## 2022-06-03 DIAGNOSIS — R4182 Altered mental status, unspecified: Secondary | ICD-10-CM | POA: Diagnosis not present

## 2022-06-03 DIAGNOSIS — M4802 Spinal stenosis, cervical region: Secondary | ICD-10-CM | POA: Diagnosis not present

## 2022-06-03 DIAGNOSIS — W1830XA Fall on same level, unspecified, initial encounter: Secondary | ICD-10-CM | POA: Diagnosis not present

## 2022-06-03 DIAGNOSIS — R0902 Hypoxemia: Secondary | ICD-10-CM | POA: Diagnosis not present

## 2022-06-03 DIAGNOSIS — W19XXXA Unspecified fall, initial encounter: Secondary | ICD-10-CM

## 2022-06-03 DIAGNOSIS — I4891 Unspecified atrial fibrillation: Secondary | ICD-10-CM | POA: Insufficient documentation

## 2022-06-03 DIAGNOSIS — F0390 Unspecified dementia without behavioral disturbance: Secondary | ICD-10-CM | POA: Diagnosis not present

## 2022-06-03 DIAGNOSIS — R001 Bradycardia, unspecified: Secondary | ICD-10-CM | POA: Diagnosis not present

## 2022-06-03 DIAGNOSIS — Z1152 Encounter for screening for COVID-19: Secondary | ICD-10-CM | POA: Diagnosis not present

## 2022-06-03 DIAGNOSIS — R456 Violent behavior: Secondary | ICD-10-CM | POA: Diagnosis not present

## 2022-06-03 DIAGNOSIS — S0990XA Unspecified injury of head, initial encounter: Secondary | ICD-10-CM | POA: Diagnosis present

## 2022-06-03 DIAGNOSIS — S0181XA Laceration without foreign body of other part of head, initial encounter: Secondary | ICD-10-CM | POA: Diagnosis not present

## 2022-06-03 LAB — CBC WITH DIFFERENTIAL/PLATELET
Abs Immature Granulocytes: 0.04 10*3/uL (ref 0.00–0.07)
Basophils Absolute: 0 10*3/uL (ref 0.0–0.1)
Basophils Relative: 0 %
Eosinophils Absolute: 0 10*3/uL (ref 0.0–0.5)
Eosinophils Relative: 0 %
HCT: 47.3 % — ABNORMAL HIGH (ref 36.0–46.0)
Hemoglobin: 15.3 g/dL — ABNORMAL HIGH (ref 12.0–15.0)
Immature Granulocytes: 0 %
Lymphocytes Relative: 7 %
Lymphs Abs: 0.8 10*3/uL (ref 0.7–4.0)
MCH: 29.9 pg (ref 26.0–34.0)
MCHC: 32.3 g/dL (ref 30.0–36.0)
MCV: 92.4 fL (ref 80.0–100.0)
Monocytes Absolute: 0.6 10*3/uL (ref 0.1–1.0)
Monocytes Relative: 6 %
Neutro Abs: 10.1 10*3/uL — ABNORMAL HIGH (ref 1.7–7.7)
Neutrophils Relative %: 87 %
Platelets: 271 10*3/uL (ref 150–400)
RBC: 5.12 MIL/uL — ABNORMAL HIGH (ref 3.87–5.11)
RDW: 15.2 % (ref 11.5–15.5)
WBC: 11.6 10*3/uL — ABNORMAL HIGH (ref 4.0–10.5)
nRBC: 0 % (ref 0.0–0.2)

## 2022-06-03 LAB — URINALYSIS, ROUTINE W REFLEX MICROSCOPIC
Bacteria, UA: NONE SEEN
Bilirubin Urine: NEGATIVE
Glucose, UA: NEGATIVE mg/dL
Hgb urine dipstick: NEGATIVE
Ketones, ur: 5 mg/dL — AB
Nitrite: NEGATIVE
Protein, ur: >=300 mg/dL — AB
Specific Gravity, Urine: 1.018 (ref 1.005–1.030)
Squamous Epithelial / HPF: NONE SEEN (ref 0–5)
WBC, UA: >50 WBC/hpf — ABNORMAL HIGH (ref 0–5)
pH: 6 (ref 5.0–8.0)

## 2022-06-03 LAB — COMPREHENSIVE METABOLIC PANEL
ALT: 23 U/L (ref 0–44)
AST: 45 U/L — ABNORMAL HIGH (ref 15–41)
Albumin: 3.7 g/dL (ref 3.5–5.0)
Alkaline Phosphatase: 67 U/L (ref 38–126)
Anion gap: 12 (ref 5–15)
BUN: 20 mg/dL (ref 8–23)
CO2: 23 mmol/L (ref 22–32)
Calcium: 9.7 mg/dL (ref 8.9–10.3)
Chloride: 105 mmol/L (ref 98–111)
Creatinine, Ser: 1.43 mg/dL — ABNORMAL HIGH (ref 0.44–1.00)
GFR, Estimated: 36 mL/min — ABNORMAL LOW (ref >60)
Glucose, Bld: 140 mg/dL — ABNORMAL HIGH (ref 70–99)
Potassium: 4.2 mmol/L (ref 3.5–5.1)
Sodium: 140 mmol/L (ref 135–145)
Total Bilirubin: 0.8 mg/dL (ref 0.3–1.2)
Total Protein: 6.9 g/dL (ref 6.5–8.1)

## 2022-06-03 LAB — PROTIME-INR
INR: 1.1 (ref 0.8–1.2)
Prothrombin Time: 13.6 seconds (ref 11.4–15.2)

## 2022-06-03 LAB — TSH: TSH: 3.038 u[IU]/mL (ref 0.350–4.500)

## 2022-06-03 LAB — RESP PANEL BY RT-PCR (FLU A&B, COVID) ARPGX2
Influenza A by PCR: NEGATIVE
Influenza B by PCR: NEGATIVE
SARS Coronavirus 2 by RT PCR: NEGATIVE

## 2022-06-03 LAB — TROPONIN I (HIGH SENSITIVITY)
Troponin I (High Sensitivity): 22 ng/L — ABNORMAL HIGH (ref <18)
Troponin I (High Sensitivity): 22 ng/L — ABNORMAL HIGH (ref <18)

## 2022-06-03 LAB — MAGNESIUM: Magnesium: 2.2 mg/dL (ref 1.7–2.4)

## 2022-06-03 LAB — BRAIN NATRIURETIC PEPTIDE: B Natriuretic Peptide: 211.4 pg/mL — ABNORMAL HIGH (ref 0.0–100.0)

## 2022-06-03 MED ORDER — SODIUM CHLORIDE 0.9 % IV SOLN
1.0000 g | Freq: Once | INTRAVENOUS | Status: AC
  Administered 2022-06-03: 1 g via INTRAVENOUS
  Filled 2022-06-03: qty 10

## 2022-06-03 MED ORDER — CEFUROXIME AXETIL 500 MG PO TABS: 500.0000 mg | ORAL_TABLET | Freq: Two times a day (BID) | ORAL | 0 refills | Status: AC

## 2022-06-03 MED ORDER — SODIUM CHLORIDE 0.9 % IV BOLUS
1000.0000 mL | Freq: Once | INTRAVENOUS | Status: AC
  Administered 2022-06-03: 1000 mL via INTRAVENOUS

## 2022-06-03 NOTE — ED Triage Notes (Signed)
Pt arrives from [facility] assisted living s/p an unwitnessed fall today, found on the ground by staff, approx 1 hour downtime. No thinners reported, there is dried blood noted to nose, small lac noted to head with dried blood. Pt combative, attempting to bite and hit staff and pull at lines, mitts placed at this time.   EMS reports HR 230's afib on arrival, no hx afib, was given '10mg'$  cardizem and was 100-120s afib post.   108/62 93% ra No BG

## 2022-06-03 NOTE — ED Provider Notes (Incomplete)
[facility] Provider Note    Event Date/Time   First MD Initiated Contact with Patient 06/03/22 1855     (approximate)   History   Fall   HPI  Amy Stanley is a 86 y.o. female with past medical history significant for dementia, lives at a SNF, who presents to the emergency department following an episode of being found down.  EMS states that the SNF said that she was found in her room down.  Approximate downtime of 1 hour.  When EMS arrived patient had a heart rate that was in the 240s.  States that they were unable to get a blood pressure, no history of atrial fibrillation so they gave her 10 mg of IV diltiazem.  States that her heart rate improved since that time.  On arrival to the emergency department patient with soft blood pressures, repeat blood pressure improved.  Patient unable to give any further history.  On chart review of the [facility] patient is not on anticoagulation.  On antipsychotic medications.     Physical Exam   Triage Vital Signs: ED Triage Vitals  Enc Vitals Group     BP      Pulse      Resp      Temp      Temp src      SpO2      Weight      Height      Head Circumference      Peak Flow      Pain Score      Pain Loc      Pain Edu?      Excl. in [place]?     Most recent vital signs: Vitals:   06/03/22 2100 06/03/22 2130  BP: 95/63 105/64  Pulse: 76 84  Resp: 19 17  Temp:    SpO2: 99% 99%    Physical Exam HENT:     Head:     Comments: Superficial laceration to the scalp that is hemostatic.  Dried blood to nares without signs of septal hematoma.    Right Ear: External ear normal.     Left Ear: External ear normal.  Eyes:     Extraocular Movements: Extraocular movements intact.     Pupils: Pupils are equal, round, and reactive to light.  Cardiovascular:     Rate and Rhythm: Tachycardia present. Rhythm irregular.  Pulmonary:     Effort: No respiratory distress.  Abdominal:     General: There is no distension.   Musculoskeletal:        General: No tenderness. Normal range of motion.     Cervical back: No tenderness.  Skin:    General: Skin is warm.  Neurological:     General: No focal deficit present.     Mental Status: She is alert.          IMPRESSION / MDM / ASSESSMENT AND PLAN / ED COURSE  I reviewed the triage vital signs and the nursing notes.  86 year old female with past medical history significant for severe dementia who presents to the emergency department following an unwitnessed fall from nursing facility.  EMS stated that her initial heart rate was in the 240s, they were unable to get a blood pressure so they gave a dose of IV diltiazem 10 mg.  Initial blood pressure patient was afebrile with soft blood pressure of 90/63.  Repeat 100/60.  Differential diagnosis including atrial fibrillation, electrolyte abnormality, intracranial hemorrhage, facial fracture, dehydration, sepsis  On chart review of prior hospitalizations and EKG patient does have a history of atrial fibrillation although it is not documented in her MAR  EKG  My interpretation of the EKG -atrial fibrillation with a heart rate of 107.  QRS 126 QTc 466.  Underlying right bundle branch block.  1 PVC.  New onset atrial fibrillation when compared to prior.  Increased rate when compared to prior.  Atrial fibrillation while on cardiac telemetry.  RADIOLOGY I independently reviewed imaging, my interpretation of imaging: No obvious fracture, no intracranial hemorrhage.  CT scans were read as no acute findings.      ED Results / Procedures / Treatments   Labs (all labs ordered are listed, but only abnormal results are displayed) Labs interpreted as -   Troponin mildly elevated which is most likely in the setting of atrial fibrillation with rapid rate.  Creatinine is at her baseline.  No significant electrolyte abnormalities.  Currently waiting for urine.  Labs Reviewed  BRAIN NATRIURETIC PEPTIDE -  Abnormal; Notable for the following components:      Result Value   B Natriuretic Peptide 211.4 (*)    All other components within normal limits  COMPREHENSIVE METABOLIC PANEL - Abnormal; Notable for the following components:   Glucose, Bld 140 (*)    Creatinine, Ser 1.43 (*)    AST 45 (*)    GFR, Estimated 36 (*)    All other components within normal limits  CBC WITH DIFFERENTIAL/PLATELET - Abnormal; Notable for the following components:   WBC 11.6 (*)    RBC 5.12 (*)    Hemoglobin 15.3 (*)    HCT 47.3 (*)    Neutro Abs 10.1 (*)    All other components within normal limits  TROPONIN I (HIGH SENSITIVITY) - Abnormal; Notable for the following components:   Troponin I (High Sensitivity) 22 (*)    All other components within normal limits  RESP PANEL BY RT-PCR (FLU A&B, COVID) ARPGX2  PROTIME-INR  TSH  MAGNESIUM  URINALYSIS, ROUTINE W REFLEX MICROSCOPIC  TROPONIN I (HIGH SENSITIVITY)       PROCEDURES:  Critical Care performed: No  Procedures  Patient's presentation is most consistent with {EM COPA:27473}   MEDICATIONS ORDERED IN ED: Medications  sodium chloride 0.9 % bolus 1,000 mL (1,000 mLs Intravenous Bolus 06/03/22 2131)    FINAL CLINICAL IMPRESSION(S) / ED DIAGNOSES   Final diagnoses:  None     Rx / DC Orders   ED Discharge Orders     None        Note:  This document was prepared using Dragon voice recognition software and may include unintentional dictation errors.

## 2022-06-03 NOTE — Discharge Instructions (Addendum)
Amy Stanley is seen in the emergency department following a fall.  She was found to have a urinary tract infection.  She had CT scans done of her head and neck that did not show any broken bones.  She was given a dose of IV Rocephin in the emergency department and IV fluids.  We will start the patient on antibiotics.  Urine was sent for culture.  Return to the emergency department for any worsening symptoms or if she is unable to tolerate p.o.

## 2022-06-04 DIAGNOSIS — I959 Hypotension, unspecified: Secondary | ICD-10-CM | POA: Diagnosis not present

## 2022-06-04 DIAGNOSIS — Z7401 Bed confinement status: Secondary | ICD-10-CM | POA: Diagnosis not present

## 2022-06-04 DIAGNOSIS — F29 Unspecified psychosis not due to a substance or known physiological condition: Secondary | ICD-10-CM | POA: Diagnosis not present

## 2022-06-04 DIAGNOSIS — F918 Other conduct disorders: Secondary | ICD-10-CM | POA: Diagnosis not present

## 2022-06-04 NOTE — ED Provider Notes (Signed)
[facility] Provider Note    Event Date/Time   First MD Initiated Contact with Patient 06/03/22 1855     (approximate)   History   Fall   HPI  Amy Stanley is a 86 y.o. female   medical history significant for dementia, lives at a SNF, who presents to the emergency department following an episode of being found down.  EMS states that the SNF said that she was found in her room down.  Approximate downtime of 1 hour.  When EMS arrived patient had a heart rate that was in the 240s.  States that they were unable to get a blood pressure, no history of atrial fibrillation so they gave her 10 mg of IV diltiazem.  States that her heart rate improved since that time.  On arrival to the emergency department patient with soft blood pressures, repeat blood pressure improved.  Patient unable to give any further history.  On chart review of the [facility] patient is not on anticoagulation.  On antipsychotic medications.        Physical Exam   Triage Vital Signs: ED Triage Vitals  Enc Vitals Group     BP 06/03/22 1916 91/63     Pulse Rate 06/03/22 1916 (!) 105     Resp 06/03/22 1916 (!) 26     Temp 06/03/22 1916 98.8 F (37.1 C)     Temp Source 06/03/22 1916 Axillary     SpO2 06/03/22 1916 96 %     Weight --      Height --      Head Circumference --      Peak Flow --      Pain Score 06/03/22 1917 0     Pain Loc --      Pain Edu? --      Excl. in [place]? --     Most recent vital signs: Vitals:   06/03/22 2230 06/03/22 2240  BP: 92/67   Pulse: 92   Resp: 18   Temp:  97.7 F (36.5 C)  SpO2: 97%     Physical Exam   HENT:     Head:     Comments: Superficial laceration to the scalp that is hemostatic.  Dried blood to nares without signs of septal hematoma.    Right Ear: External ear normal.     Left Ear: External ear normal.  Eyes:     Extraocular Movements: Extraocular movements intact.     Pupils: Pupils are equal, round, and reactive to light.   Cardiovascular:     Rate and Rhythm: Tachycardia present. Rhythm irregular.  Pulmonary:     Effort: No respiratory distress.  Abdominal:     General: There is no distension.  Musculoskeletal:        General: No tenderness. Normal range of motion.     Cervical back: No tenderness.  Skin:    General: Skin is warm.  Neurological:     General: No focal deficit present.     Mental Status: She is alert.       IMPRESSION / MDM / ASSESSMENT AND PLAN / ED COURSE  I reviewed the triage vital signs and the nursing notes.  86 year old female with past medical history significant for severe dementia who presents to the emergency department following an unwitnessed fall from nursing facility.   EMS stated that her initial heart rate was in the 240s, they were unable to get a blood pressure so they gave a dose of  IV diltiazem 10 mg.   Initial blood pressure patient was afebrile with soft blood pressure of 90/63.  Repeat 100/60.   Differential diagnosis including atrial fibrillation, electrolyte abnormality, intracranial hemorrhage, facial fracture, dehydration, sepsis   On chart review of prior hospitalizations and EKG patient does have a history of atrial fibrillation although it is not documented in her MAR  EKG   My interpretation of the EKG -atrial fibrillation with a heart rate of 107.  QRS 126 QTc 466.  Underlying right bundle branch block.  1 PVC.  New onset atrial fibrillation when compared to prior.  Increased rate when compared to prior.   Atrial fibrillation while on cardiac telemetry.   RADIOLOGY I independently reviewed imaging, my interpretation of imaging: No obvious fracture, no intracranial hemorrhage.   CT scans were read as no acute findings.      ED Results / Procedures / Treatments   Labs (all labs ordered are listed, but only abnormal results are displayed) Labs interpreted as -  Troponin mildly elevated which is most likely in the setting of atrial  fibrillation with rapid rate.  Creatinine is at her baseline.  No significant electrolyte abnormalities.  Currently waiting for urine.   Labs Reviewed  BRAIN NATRIURETIC PEPTIDE - Abnormal; Notable for the following components:      Result Value   B Natriuretic Peptide 211.4 (*)    All other components within normal limits  COMPREHENSIVE METABOLIC PANEL - Abnormal; Notable for the following components:   Glucose, Bld 140 (*)    Creatinine, Ser 1.43 (*)    AST 45 (*)    GFR, Estimated 36 (*)    All other components within normal limits  CBC WITH DIFFERENTIAL/PLATELET - Abnormal; Notable for the following components:   WBC 11.6 (*)    RBC 5.12 (*)    Hemoglobin 15.3 (*)    HCT 47.3 (*)    Neutro Abs 10.1 (*)    All other components within normal limits  URINALYSIS, ROUTINE W REFLEX MICROSCOPIC - Abnormal; Notable for the following components:   Color, Urine YELLOW (*)    APPearance TURBID (*)    Ketones, ur 5 (*)    Protein, ur >=300 (*)    Leukocytes,Ua MODERATE (*)    WBC, UA >50 (*)    All other components within normal limits  TROPONIN I (HIGH SENSITIVITY) - Abnormal; Notable for the following components:   Troponin I (High Sensitivity) 22 (*)    All other components within normal limits  TROPONIN I (HIGH SENSITIVITY) - Abnormal; Notable for the following components:   Troponin I (High Sensitivity) 22 (*)    All other components within normal limits  RESP PANEL BY RT-PCR (FLU A&B, COVID) ARPGX2  URINE CULTURE  PROTIME-INR  TSH  MAGNESIUM    Urine appears acutely infected.  Urine culture was added on.  On chart review no history of a resistant urinary tract infection.  Given an IV fluid bolus and IV Rocephin.  We will start the patient on antibiotics as an outpatient.  Given return precautions to facility.  We will monitor in the emergency department patient did not have any further episodes of tachycardia.   PROCEDURES:  Critical Care performed:  No  Procedures  Patient's presentation is most consistent with acute presentation with potential threat to life or bodily function.   MEDICATIONS ORDERED IN ED: Medications  sodium chloride 0.9 % bolus 1,000 mL (0 mLs Intravenous Stopped 06/03/22 2238)  cefTRIAXone (ROCEPHIN) 1 g  in sodium chloride 0.9 % 100 mL IVPB (0 g Intravenous Stopped 06/03/22 2324)    FINAL CLINICAL IMPRESSION(S) / ED DIAGNOSES   Final diagnoses:  Fall, initial encounter  Atrial fibrillation, unspecified type ([place])  Urinary tract infection without hematuria, site unspecified     Rx / DC Orders   ED Discharge Orders          Ordered    cefUROXime (CEFTIN) 500 MG tablet  2 times daily with meals        06/03/22 2320             Note:  This document was prepared using Dragon voice recognition software and may include unintentional dictation errors.   Nathaniel Man, MD 06/04/22 0004

## 2022-06-05 LAB — URINE CULTURE

## 2022-06-10 NOTE — Progress Notes (Signed)
Ordered for shortness of breath

## 2022-06-13 ENCOUNTER — Telehealth: Payer: Self-pay | Admitting: *Deleted

## 2022-07-07 IMAGING — CT CT HEAD W/O CM
4 series · 16 of 47 positions shown, 18 images · non-contrast
Comparison: Head CT 09/09/2021.

CLINICAL DATA: 86-year-old female status post unwitnessed fall.
Struck head.



[Series 2: head wo · axial · 0.42mm/px · z∈[-158,-43]mm · 7 of 31 slices shown, 9 images]
[im 4/31  brain]
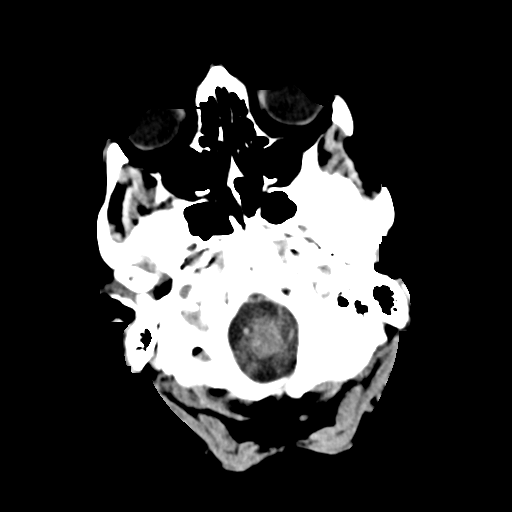
[im 4/31  bone]
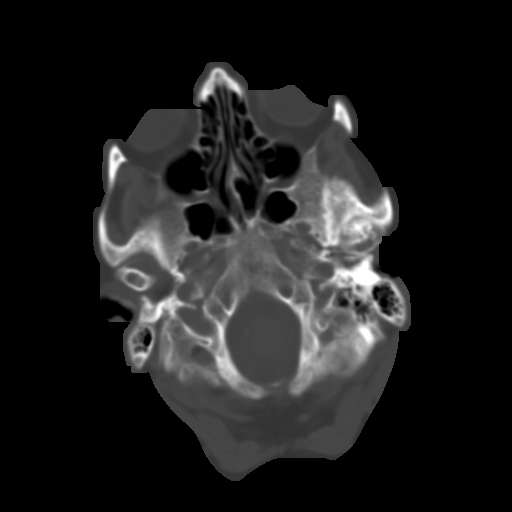
[im 8/31  brain]
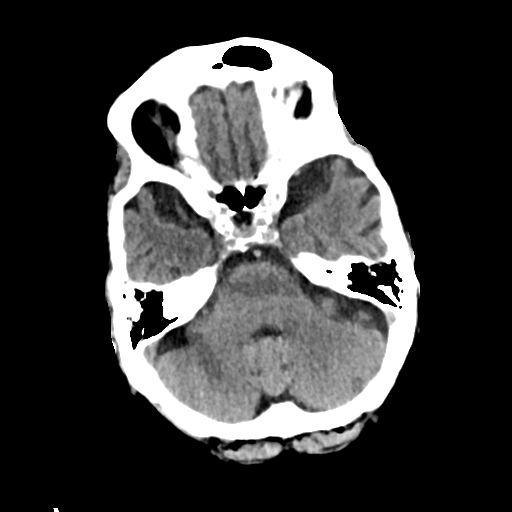
[im 12/31  brain]
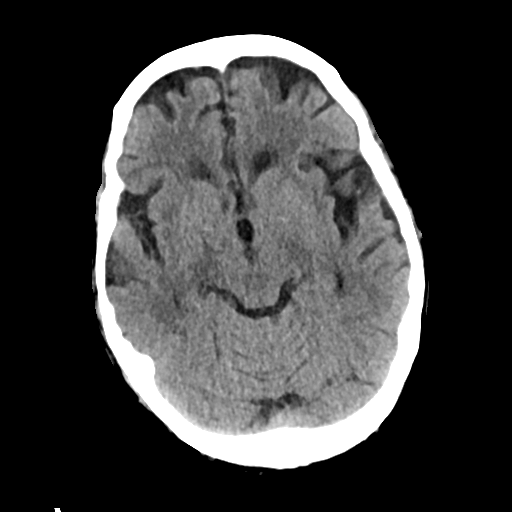
[im 16/31  brain]
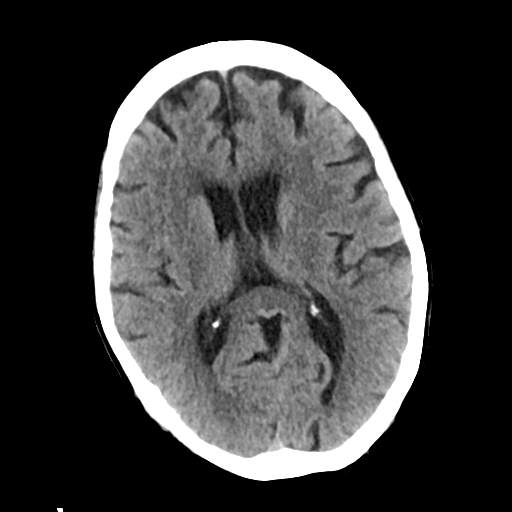
[im 19/31  brain]
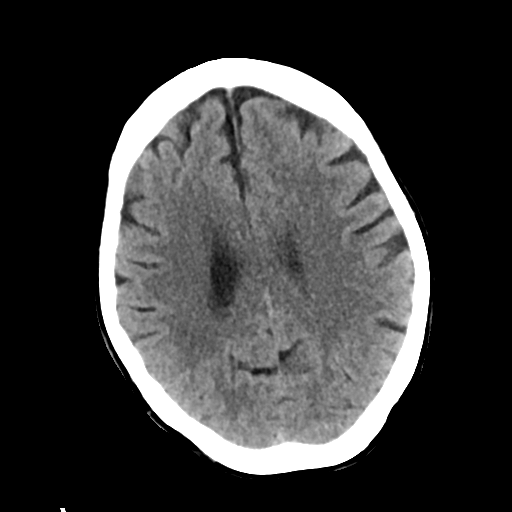
[im 19/31  bone]
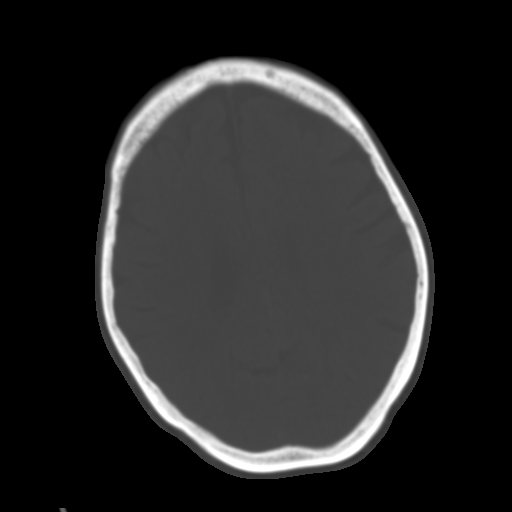
[im 23/31  brain]
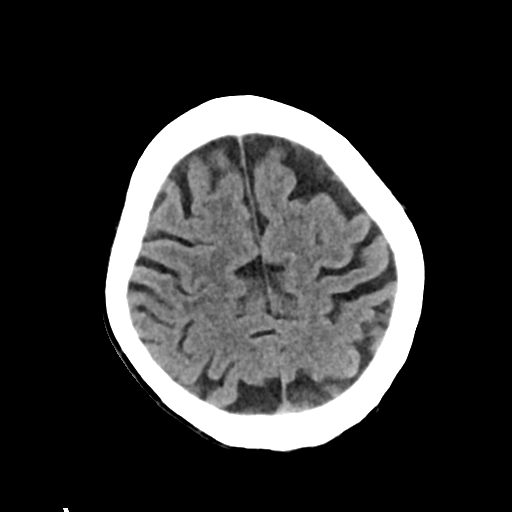
[im 27/31  brain]
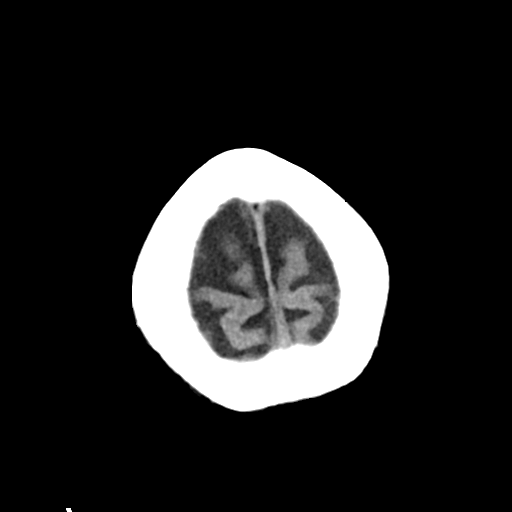

[Series 3: head bone · axial · 0.42mm/px · z∈[-159,-127]mm · 3 of 78 slices shown]
[im 8/78  bone]
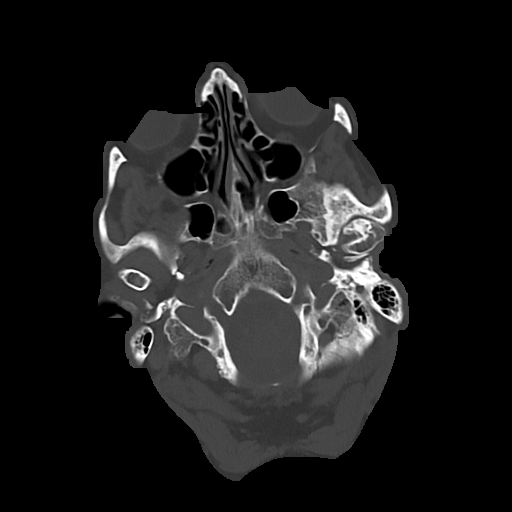
[im 16/78  bone]
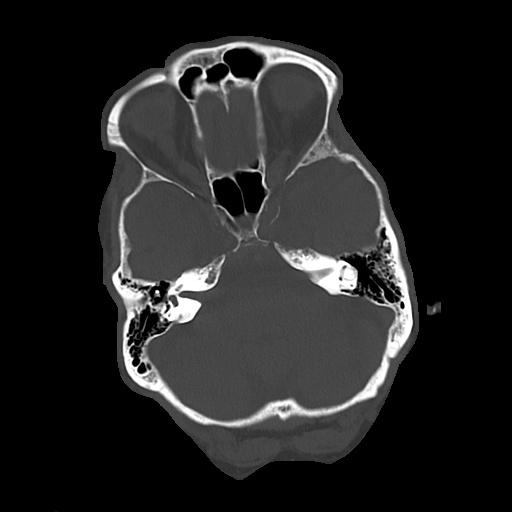
[im 24/78  bone]
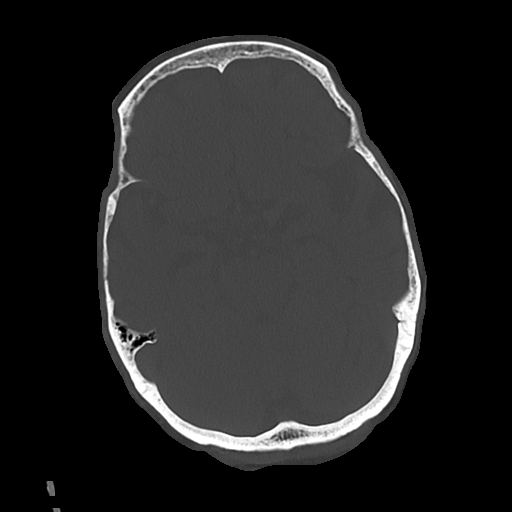

[Series 4: cor soft · coronal · 0.32mm/px · 3 of 71 slices shown]
[im 25/71  brain]
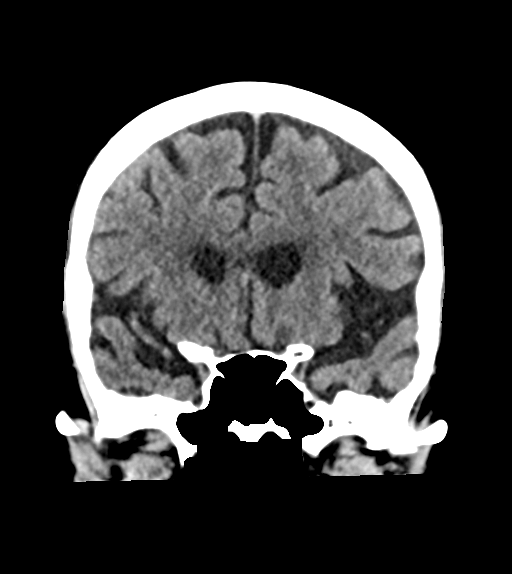
[im 32/71  brain]
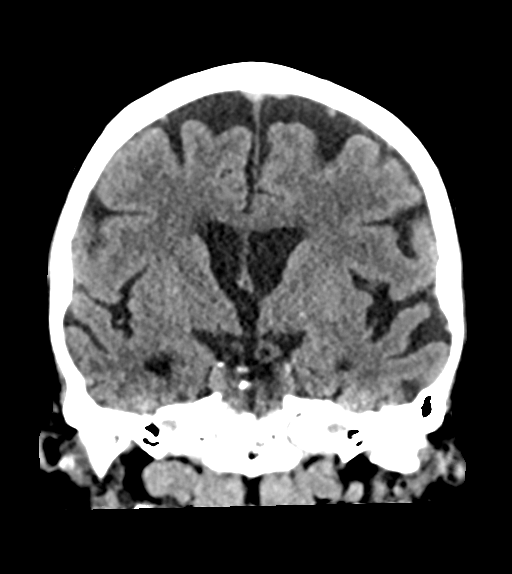
[im 39/71  brain]
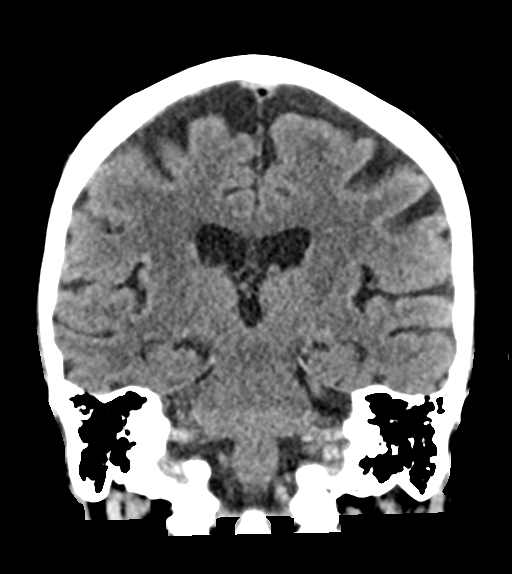

[Series 5: sag soft · sagittal · 0.35mm/px · 3 of 55 slices shown]
[im 19/55  brain]
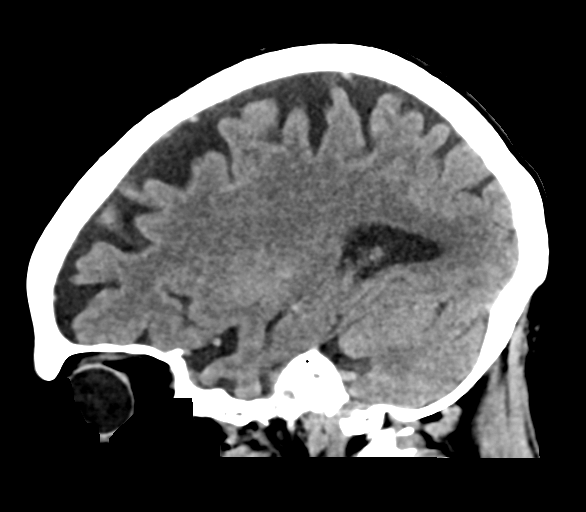
[im 28/55  brain]
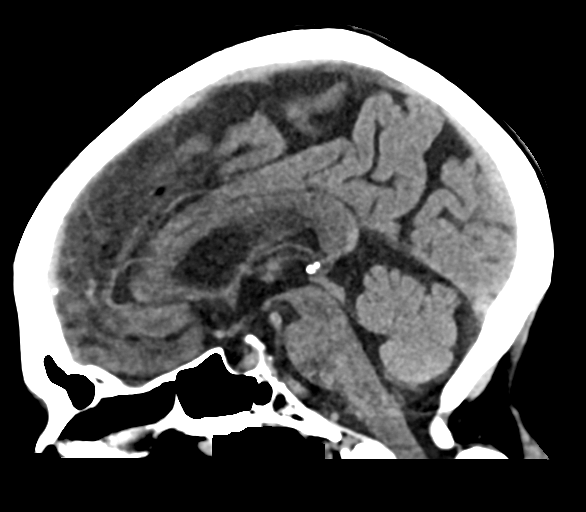
[im 37/55  brain]
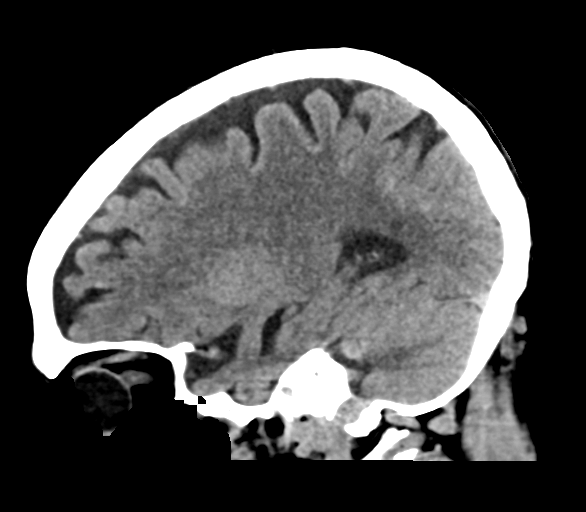

[16 of 47 positions shown; findings below may reference images not displayed]

FINDINGS: Brain: Stable non contrast CT appearance of the brain. Largely
unremarkable for age. No midline shift, ventriculomegaly, mass
effect, evidence of mass lesion, intracranial hemorrhage or evidence
of cortically based acute infarction. No convincing
encephalomalacia.

Vascular: Calcified atherosclerosis at the skull base. No suspicious
intracranial vascular hyperdensity.

Skull: No acute osseous abnormality identified.

Sinuses/Orbits: Visualized paranasal sinuses and mastoids are clear.

Other: No acute orbit or scalp soft tissue injury identified.
IMPRESSION: No acute traumatic injury identified. Stable since [REDACTED] and
Negative for age.

## 2022-07-07 IMAGING — CT CT CERVICAL SPINE W/O CM
2 series · 14 of 27 positions shown, 18 images · non-contrast
Comparison: Head CT today. Cervical spine CT 09/09/2021.

CLINICAL DATA: 86-year-old female status post unwitnessed fall.
Struck head.



[Series 3: c spine soft · axial · 0.32mm/px · z∈[-271,-141]mm · 9 of 77 slices shown, 12 images]
[im 6/77  soft-tissue]
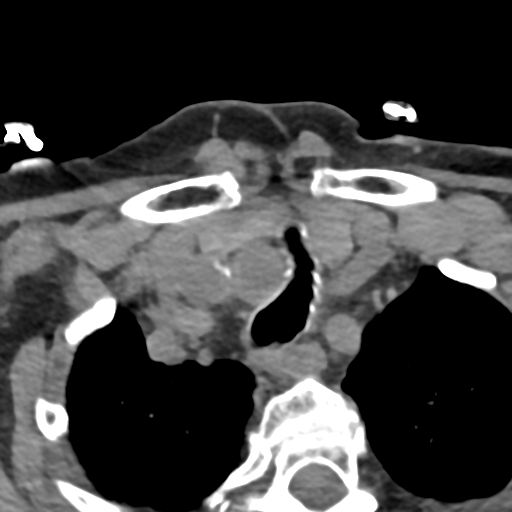
[im 6/77  bone]
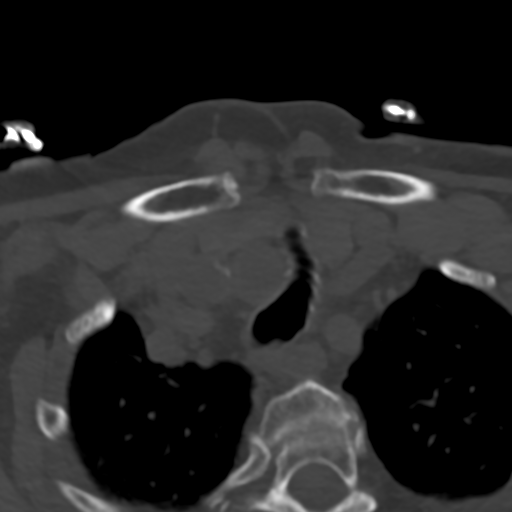
[im 18/77  bone]
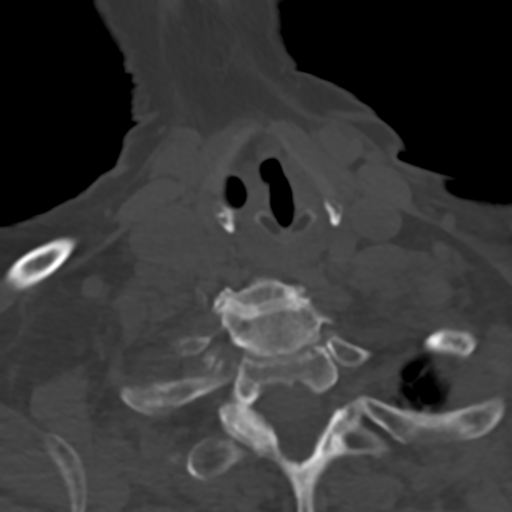
[im 24/77  bone]
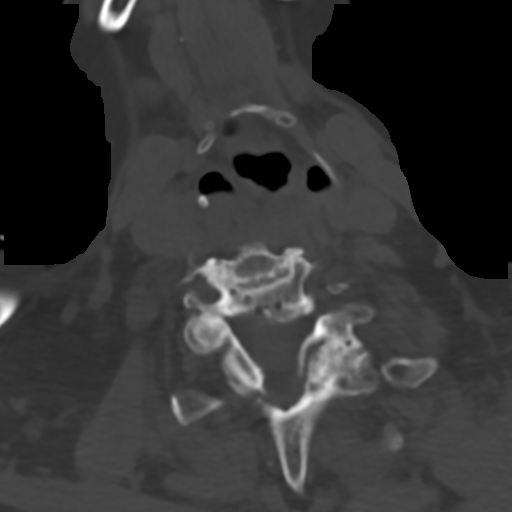
[im 30/77  bone]
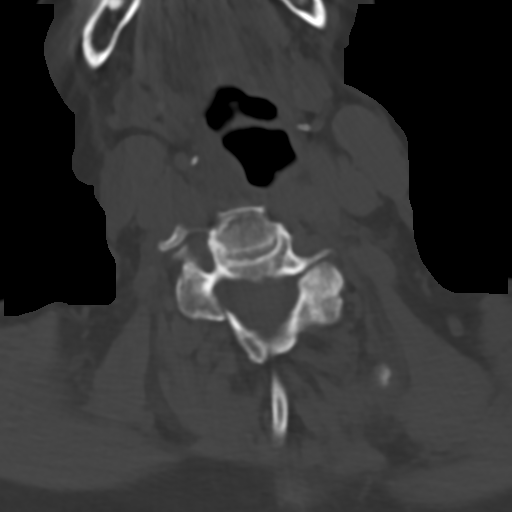
[im 41/77  soft-tissue]
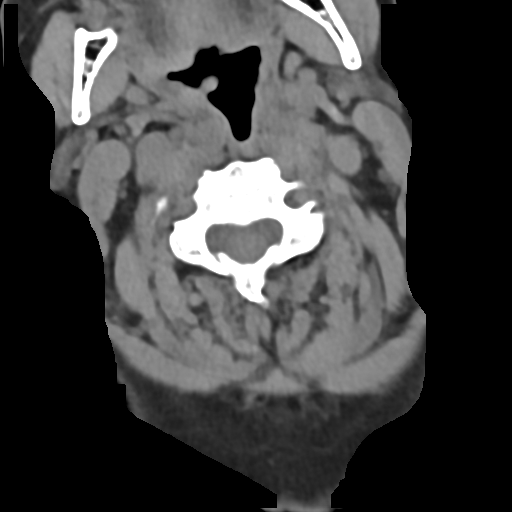
[im 41/77  bone]
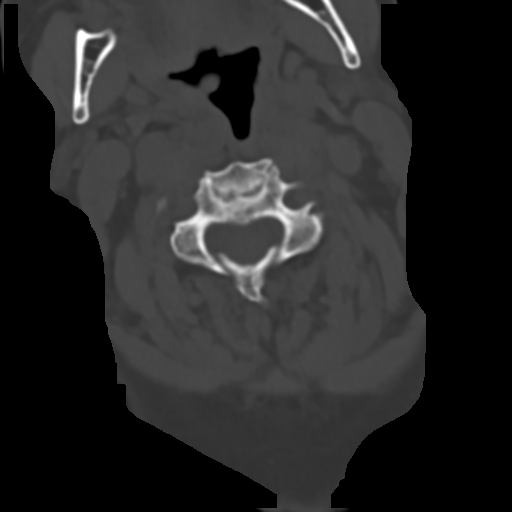
[im 47/77  bone]
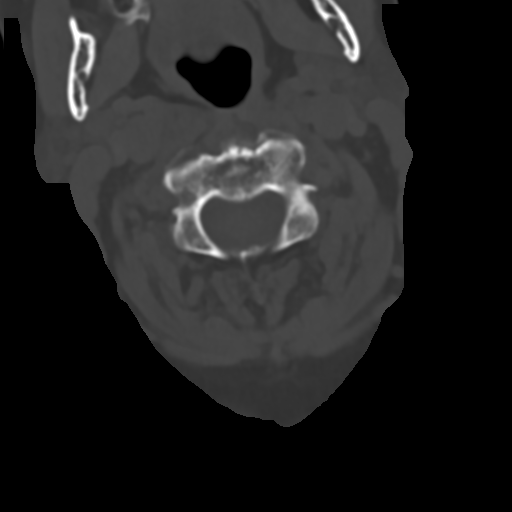
[im 53/77  bone]
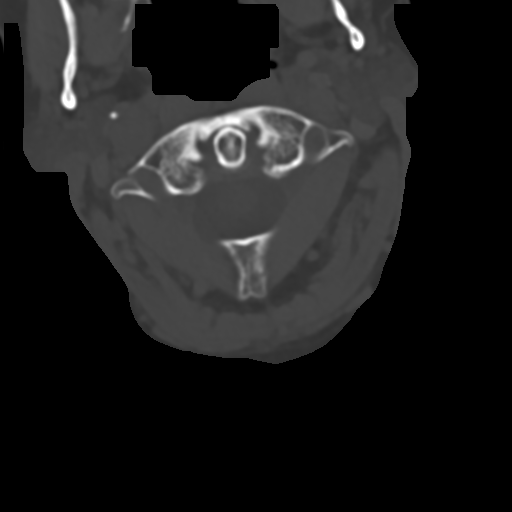
[im 65/77  bone]
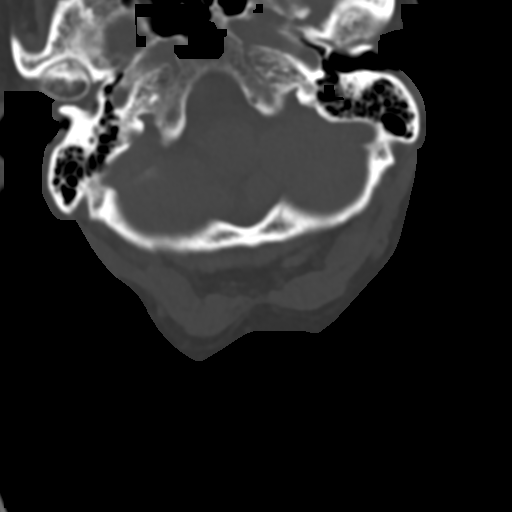
[im 71/77  soft-tissue]
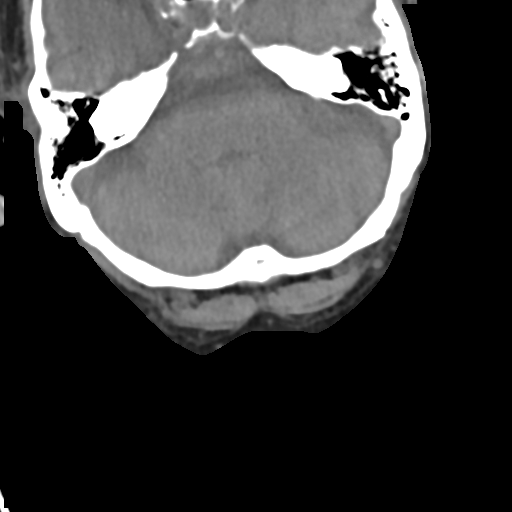
[im 71/77  bone]
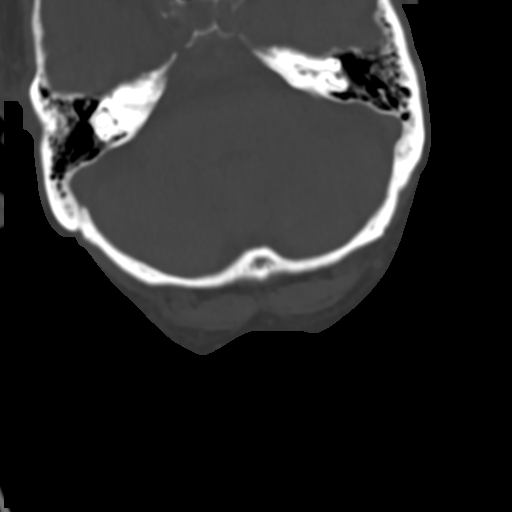

[Series 5: sag bone · sagittal · 0.32mm/px · 5 of 79 slices shown, 6 images]
[im 27/79  bone]
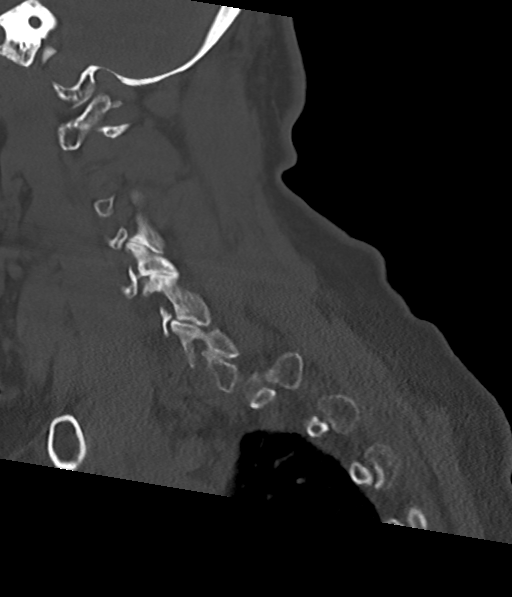
[im 33/79  bone]
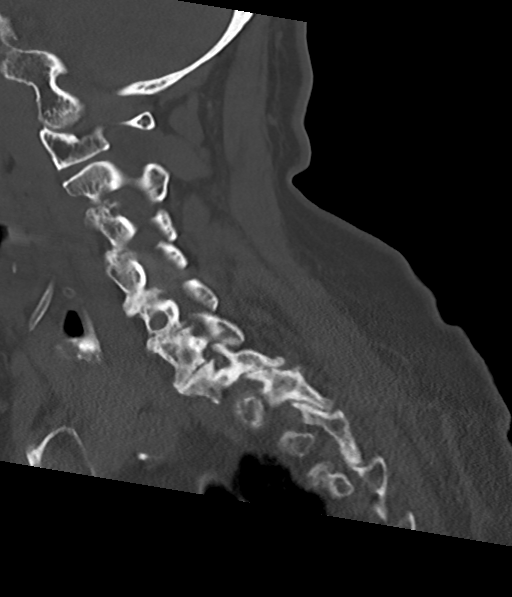
[im 40/79  soft-tissue]
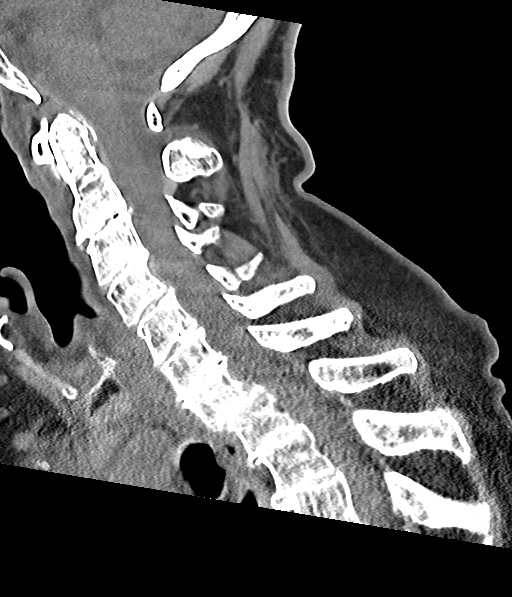
[im 40/79  bone]
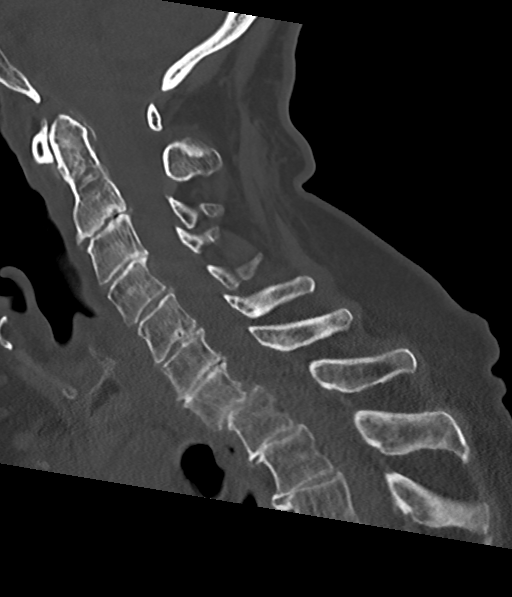
[im 46/79  bone]
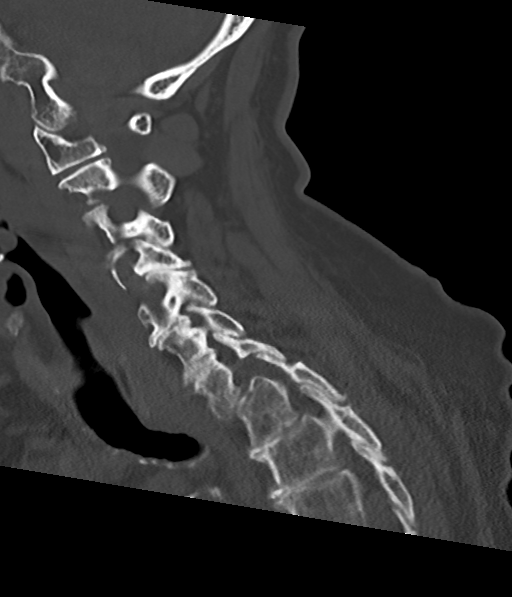
[im 53/79  bone]
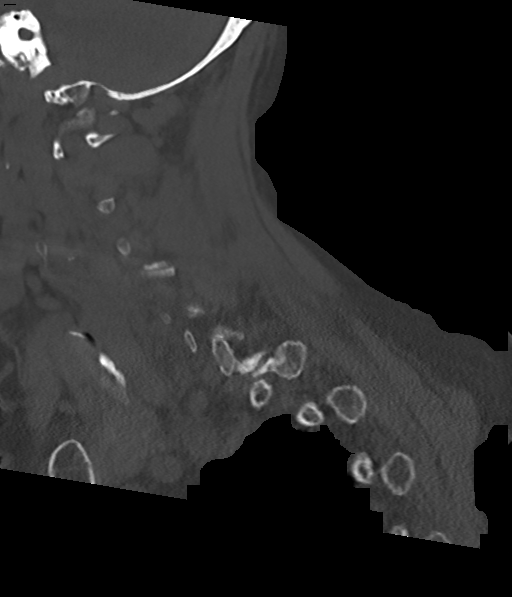

[14 of 27 positions shown; findings below may reference images not displayed]

FINDINGS: Alignment: Stable since [REDACTED]. Generalized straightening of
cervical lordosis with chronic degenerative appearing
anterolisthesis of C7 on T1 measuring 3-4 mm. Bilateral posterior
element alignment maintained. Lesser chronic anterolisthesis at
C4-C5.

Skull base and vertebrae: Visualized skull base is intact. No
atlanto-occipital dissociation. C1 and C2 appear intact and aligned.
No acute osseous abnormality identified.

Soft tissues and spinal canal: No prevertebral fluid or swelling. No
visible canal hematoma. Visible noncontrast neck soft tissues
appears stable, negative.

Disc levels: Widespread chronic cervical spine degeneration appears
stable since [REDACTED]. Mild if any associated cervical spinal
stenosis suspected.

Upper chest: Visible upper thoracic levels appears stable and
intact. Negative lung apices.
IMPRESSION: 1. No acute traumatic injury identified in the cervical spine.
2. Stable chronic cervical spine degeneration with
spondylolisthesis.

## 2022-08-04 DEATH — deceased
